# Patient Record
Sex: Female | Born: 1965 | Race: White | Hispanic: No | Marital: Married | Smoking: Never smoker
Health system: Southern US, Community
[De-identification: ages and names within clinical notes are randomized; demographics above are authoritative.]

## PROBLEM LIST (undated history)

## (undated) DIAGNOSIS — I499 Cardiac arrhythmia, unspecified: Secondary | ICD-10-CM

## (undated) DIAGNOSIS — E785 Hyperlipidemia, unspecified: Secondary | ICD-10-CM

## (undated) DIAGNOSIS — E669 Obesity, unspecified: Secondary | ICD-10-CM

## (undated) DIAGNOSIS — IMO0001 Reserved for inherently not codable concepts without codable children: Secondary | ICD-10-CM

## (undated) DIAGNOSIS — F419 Anxiety disorder, unspecified: Secondary | ICD-10-CM

## (undated) DIAGNOSIS — J45909 Unspecified asthma, uncomplicated: Secondary | ICD-10-CM

## (undated) DIAGNOSIS — K219 Gastro-esophageal reflux disease without esophagitis: Secondary | ICD-10-CM

## (undated) DIAGNOSIS — D649 Anemia, unspecified: Secondary | ICD-10-CM

## (undated) DIAGNOSIS — R002 Palpitations: Secondary | ICD-10-CM

## (undated) DIAGNOSIS — E039 Hypothyroidism, unspecified: Secondary | ICD-10-CM

## (undated) DIAGNOSIS — I10 Essential (primary) hypertension: Secondary | ICD-10-CM

## (undated) DIAGNOSIS — R0789 Other chest pain: Secondary | ICD-10-CM

## (undated) HISTORY — PX: DILATION AND CURETTAGE OF UTERUS: SHX78

## (undated) HISTORY — DX: Essential (primary) hypertension: I10

## (undated) HISTORY — DX: Hyperlipidemia, unspecified: E78.5

## (undated) HISTORY — DX: Other chest pain: R07.89

## (undated) HISTORY — DX: Palpitations: R00.2

## (undated) HISTORY — DX: Obesity, unspecified: E66.9

## (undated) HISTORY — PX: COLONOSCOPY: SHX174

## (undated) HISTORY — PX: BREAST REDUCTION SURGERY: SHX8

---

## 1996-04-07 HISTORY — PX: REDUCTION MAMMAPLASTY: SUR839

## 2005-02-14 ENCOUNTER — Ambulatory Visit: Payer: Self-pay | Admitting: Internal Medicine

## 2005-03-05 ENCOUNTER — Ambulatory Visit: Payer: Self-pay | Admitting: Internal Medicine

## 2005-04-03 ENCOUNTER — Ambulatory Visit: Payer: Self-pay | Admitting: Gastroenterology

## 2006-02-17 ENCOUNTER — Ambulatory Visit: Payer: Self-pay | Admitting: Internal Medicine

## 2007-06-24 ENCOUNTER — Ambulatory Visit: Payer: Self-pay | Admitting: Internal Medicine

## 2007-09-10 ENCOUNTER — Ambulatory Visit: Payer: Self-pay | Admitting: Surgery

## 2007-10-26 ENCOUNTER — Emergency Department: Payer: Self-pay | Admitting: Emergency Medicine

## 2007-10-26 ENCOUNTER — Other Ambulatory Visit: Payer: Self-pay

## 2008-01-19 ENCOUNTER — Ambulatory Visit: Payer: Self-pay | Admitting: Internal Medicine

## 2009-02-07 ENCOUNTER — Ambulatory Visit: Payer: Self-pay | Admitting: Internal Medicine

## 2009-11-12 ENCOUNTER — Ambulatory Visit: Payer: Self-pay | Admitting: Gastroenterology

## 2010-02-08 ENCOUNTER — Ambulatory Visit: Payer: Self-pay | Admitting: Internal Medicine

## 2010-03-26 ENCOUNTER — Emergency Department: Payer: Self-pay | Admitting: Emergency Medicine

## 2010-05-10 ENCOUNTER — Ambulatory Visit: Payer: Self-pay | Admitting: Internal Medicine

## 2010-05-14 ENCOUNTER — Ambulatory Visit: Payer: Self-pay | Admitting: Internal Medicine

## 2010-06-06 ENCOUNTER — Ambulatory Visit: Payer: Self-pay | Admitting: Internal Medicine

## 2010-07-07 ENCOUNTER — Ambulatory Visit: Payer: Self-pay | Admitting: Internal Medicine

## 2010-08-06 ENCOUNTER — Ambulatory Visit: Payer: Self-pay | Admitting: Internal Medicine

## 2010-08-12 ENCOUNTER — Ambulatory Visit: Payer: Self-pay | Admitting: Internal Medicine

## 2010-09-06 ENCOUNTER — Ambulatory Visit: Payer: Self-pay | Admitting: Internal Medicine

## 2010-09-12 ENCOUNTER — Ambulatory Visit: Payer: Self-pay | Admitting: Internal Medicine

## 2010-10-06 ENCOUNTER — Ambulatory Visit: Payer: Self-pay | Admitting: Internal Medicine

## 2010-12-23 ENCOUNTER — Ambulatory Visit: Payer: Self-pay | Admitting: Internal Medicine

## 2011-01-06 ENCOUNTER — Ambulatory Visit: Payer: Self-pay | Admitting: Internal Medicine

## 2011-01-09 ENCOUNTER — Ambulatory Visit: Payer: Self-pay | Admitting: Internal Medicine

## 2011-02-06 ENCOUNTER — Ambulatory Visit: Payer: Self-pay | Admitting: Internal Medicine

## 2011-05-02 ENCOUNTER — Ambulatory Visit: Payer: Self-pay | Admitting: Internal Medicine

## 2011-05-02 LAB — CBC CANCER CENTER
Basophil %: 0.7 %
Eosinophil %: 3.6 %
HGB: 11.8 g/dL — ABNORMAL LOW (ref 12.0–16.0)
Lymphocyte #: 1.9 x10 3/mm (ref 1.0–3.6)
Lymphocyte %: 23.5 %
MCHC: 35.1 g/dL (ref 32.0–36.0)
MCV: 94 fL (ref 80–100)
Monocyte #: 0.6 x10 3/mm (ref 0.0–0.7)
Monocyte %: 6.9 %
Neutrophil #: 5.2 x10 3/mm (ref 1.4–6.5)
Neutrophil %: 65.3 %
Platelet: 308 x10 3/mm (ref 150–440)
WBC: 8 x10 3/mm (ref 3.6–11.0)

## 2011-05-09 ENCOUNTER — Ambulatory Visit: Payer: Self-pay | Admitting: Internal Medicine

## 2011-05-22 ENCOUNTER — Ambulatory Visit: Payer: Self-pay | Admitting: Internal Medicine

## 2011-10-05 ENCOUNTER — Emergency Department: Payer: Self-pay | Admitting: *Deleted

## 2011-10-05 LAB — BASIC METABOLIC PANEL
Anion Gap: 8 (ref 7–16)
BUN: 21 mg/dL — ABNORMAL HIGH (ref 7–18)
Calcium, Total: 8.8 mg/dL (ref 8.5–10.1)
Chloride: 103 mmol/L (ref 98–107)
Co2: 29 mmol/L (ref 21–32)
Creatinine: 1.01 mg/dL (ref 0.60–1.30)
EGFR (African American): >60
EGFR (Non-African Amer.): >60
Glucose: 112 mg/dL — ABNORMAL HIGH (ref 65–99)
Osmolality: 283 (ref 275–301)
Potassium: 3.3 mmol/L — ABNORMAL LOW (ref 3.5–5.1)
Sodium: 140 mmol/L (ref 136–145)

## 2011-10-05 LAB — CBC
HCT: 39 % (ref 35.0–47.0)
MCH: 33.1 pg (ref 26.0–34.0)
MCHC: 34.7 g/dL (ref 32.0–36.0)
MCV: 95 fL (ref 80–100)
Platelet: 267 10*3/uL (ref 150–440)
RBC: 4.1 10*6/uL (ref 3.80–5.20)
RDW: 11.8 % (ref 11.5–14.5)
WBC: 8.7 10*3/uL (ref 3.6–11.0)

## 2011-10-05 LAB — TROPONIN I
Troponin-I: <0.02 ng/mL
Troponin-I: <0.02 ng/mL

## 2011-10-05 LAB — CK TOTAL AND CKMB (NOT AT ARMC)
CK, Total: 96 U/L (ref 21–215)
CK-MB: 1.1 ng/mL (ref 0.5–3.6)
CK-MB: 1 ng/mL (ref 0.5–3.6)

## 2011-12-29 ENCOUNTER — Ambulatory Visit: Payer: Self-pay | Admitting: Internal Medicine

## 2011-12-29 LAB — CBC CANCER CENTER
Basophil #: 0.1 x10 3/mm (ref 0.0–0.1)
Eosinophil %: 2 %
HGB: 13.5 g/dL (ref 12.0–16.0)
Lymphocyte #: 2.4 x10 3/mm (ref 1.0–3.6)
Lymphocyte %: 25 %
MCH: 32.3 pg (ref 26.0–34.0)
MCV: 96 fL (ref 80–100)
Monocyte #: 0.6 x10 3/mm (ref 0.2–0.9)
Monocyte %: 6.1 %
Neutrophil #: 6.2 x10 3/mm (ref 1.4–6.5)
Neutrophil %: 66.3 %
Platelet: 311 x10 3/mm (ref 150–440)
RBC: 4.17 10*6/uL (ref 3.80–5.20)
RDW: 11.6 % (ref 11.5–14.5)
WBC: 9.4 x10 3/mm (ref 3.6–11.0)

## 2011-12-29 LAB — LACTATE DEHYDROGENASE: LDH: 179 U/L (ref 81–246)

## 2012-01-06 ENCOUNTER — Ambulatory Visit: Payer: Self-pay | Admitting: Internal Medicine

## 2012-05-24 ENCOUNTER — Ambulatory Visit: Payer: Self-pay | Admitting: Internal Medicine

## 2013-04-27 ENCOUNTER — Emergency Department: Payer: Self-pay | Admitting: Emergency Medicine

## 2013-04-27 LAB — BASIC METABOLIC PANEL WITH GFR
Anion Gap: 5 — ABNORMAL LOW
BUN: 18 mg/dL
Calcium, Total: 9.2 mg/dL
Chloride: 101 mmol/L
Co2: 32 mmol/L
Creatinine: 0.88 mg/dL
EGFR (African American): >60
EGFR (Non-African Amer.): >60
Glucose: 83 mg/dL
Osmolality: 277
Potassium: 3.8 mmol/L
Sodium: 138 mmol/L

## 2013-04-27 LAB — TROPONIN I: Troponin-I: <0.02 ng/mL

## 2013-04-27 LAB — CBC
HCT: 44.1 % (ref 35.0–47.0)
HGB: 15.3 g/dL (ref 12.0–16.0)
MCH: 32.3 pg (ref 26.0–34.0)
MCHC: 34.7 g/dL (ref 32.0–36.0)
MCV: 93 fL (ref 80–100)
Platelet: 340 10*3/uL (ref 150–440)
RBC: 4.73 10*6/uL (ref 3.80–5.20)
RDW: 11.8 % (ref 11.5–14.5)
WBC: 10 10*3/uL (ref 3.6–11.0)

## 2013-07-12 ENCOUNTER — Ambulatory Visit: Payer: Self-pay | Admitting: Internal Medicine

## 2013-08-24 ENCOUNTER — Emergency Department: Payer: Self-pay | Admitting: Internal Medicine

## 2013-08-24 LAB — TROPONIN I: Troponin-I: <0.02 ng/mL

## 2013-08-24 LAB — BASIC METABOLIC PANEL
Anion Gap: 8 (ref 7–16)
BUN: 17 mg/dL (ref 7–18)
CHLORIDE: 103 mmol/L (ref 98–107)
CO2: 27 mmol/L (ref 21–32)
Calcium, Total: 9.3 mg/dL (ref 8.5–10.1)
Creatinine: 0.85 mg/dL (ref 0.60–1.30)
EGFR (Non-African Amer.): >60
Glucose: 93 mg/dL (ref 65–99)
Osmolality: 277 (ref 275–301)
Potassium: 3.6 mmol/L (ref 3.5–5.1)
Sodium: 138 mmol/L (ref 136–145)

## 2013-08-24 LAB — CBC
HCT: 43.4 % (ref 35.0–47.0)
HGB: 15.1 g/dL (ref 12.0–16.0)
MCH: 32.2 pg (ref 26.0–34.0)
MCHC: 34.8 g/dL (ref 32.0–36.0)
MCV: 93 fL (ref 80–100)
Platelet: 296 10*3/uL (ref 150–440)
RBC: 4.69 10*6/uL (ref 3.80–5.20)
RDW: 11.7 % (ref 11.5–14.5)
WBC: 9.1 10*3/uL (ref 3.6–11.0)

## 2013-08-24 LAB — D-DIMER(ARMC): D-Dimer: 435 ng/ml

## 2014-07-17 ENCOUNTER — Ambulatory Visit
Admit: 2014-07-17 | Disposition: A | Discharge status: Home / Self Care | Payer: Self-pay | Attending: Internal Medicine | Admitting: Internal Medicine

## 2014-11-11 ENCOUNTER — Emergency Department
Admission: EM | Admit: 2014-11-11 | Discharge: 2014-11-11 | Disposition: A | Discharge status: Home / Self Care | Payer: BLUE CROSS/BLUE SHIELD | Attending: Emergency Medicine | Admitting: Emergency Medicine

## 2014-11-11 ENCOUNTER — Emergency Department: Payer: BLUE CROSS/BLUE SHIELD

## 2014-11-11 ENCOUNTER — Encounter: Payer: Self-pay | Admitting: Emergency Medicine

## 2014-11-11 DIAGNOSIS — R079 Chest pain, unspecified: Secondary | ICD-10-CM

## 2014-11-11 DIAGNOSIS — R0602 Shortness of breath: Secondary | ICD-10-CM | POA: Diagnosis not present

## 2014-11-11 HISTORY — DX: Hypothyroidism, unspecified: E03.9

## 2014-11-11 LAB — BASIC METABOLIC PANEL
Anion gap: 9 (ref 5–15)
BUN: 15 mg/dL (ref 6–20)
CALCIUM: 9.3 mg/dL (ref 8.9–10.3)
CHLORIDE: 101 mmol/L (ref 101–111)
CO2: 29 mmol/L (ref 22–32)
Creatinine, Ser: 0.75 mg/dL (ref 0.44–1.00)
GFR calc Af Amer: >60 mL/min (ref >60)
Glucose, Bld: 109 mg/dL — ABNORMAL HIGH (ref 65–99)
Potassium: 3.6 mmol/L (ref 3.5–5.1)
SODIUM: 139 mmol/L (ref 135–145)

## 2014-11-11 LAB — CBC
HCT: 40.6 % (ref 35.0–47.0)
HEMOGLOBIN: 14.2 g/dL (ref 12.0–16.0)
MCH: 32.1 pg (ref 26.0–34.0)
MCHC: 35 g/dL (ref 32.0–36.0)
MCV: 91.8 fL (ref 80.0–100.0)
Platelets: 268 10*3/uL (ref 150–440)
RBC: 4.42 MIL/uL (ref 3.80–5.20)
RDW: 11.5 % (ref 11.5–14.5)
WBC: 9.5 10*3/uL (ref 3.6–11.0)

## 2014-11-11 LAB — TROPONIN I: Troponin I: <0.03 ng/mL (ref <0.031)

## 2014-11-11 MED ORDER — GI COCKTAIL ~~LOC~~
30.0000 mL | Freq: Once | ORAL | Status: AC
  Administered 2014-11-11: 30 mL via ORAL
  Filled 2014-11-11: qty 30

## 2014-11-11 MED ORDER — RANITIDINE HCL 150 MG PO TABS: 150.0000 mg | ORAL_TABLET | Freq: Every day | ORAL | Status: DC

## 2014-11-11 MED ORDER — DIAZEPAM 5 MG PO TABS: 5.0000 mg | ORAL_TABLET | Freq: Three times a day (TID) | ORAL | Status: DC | PRN

## 2014-11-11 MED ORDER — FAMOTIDINE 20 MG PO TABS
20.0000 mg | ORAL_TABLET | Freq: Once | ORAL | Status: AC
  Administered 2014-11-11: 20 mg via ORAL
  Filled 2014-11-11: qty 1

## 2014-11-11 NOTE — ED Notes (Signed)
Pt reports left sided CP starting last night, described as pressure.  Pain radiated to back.  Pt reports numbness to left arm and leg.  Pt reports SOB.  Denies nausea.  Pt reports being recently experienced sick with dizziness a few days ago and general malaise over the past week.  Pt NAD at this time.

## 2014-11-11 NOTE — ED Provider Notes (Signed)
[facility] Emergency Department Provider Note     Time seen: ----------------------------------------- 7:09 AM on 11/11/2014 -----------------------------------------    I have reviewed the triage vital signs and the nursing notes.   HISTORY  Chief Complaint Chest Pain    HPI Kimberly Rivas is a 49 y.o. female who presents ER for left-sided chest pain started last night. Describes as a burning pain, she reports numbness to the left arm and leg with some shortness of breath. She denies any nausea. Patient reports being under a lot of stress, no complaints this time. Patient states symptoms come and go.   Past Medical History  Diagnosis Date  . Hypothyroid     There are no active problems to display for this patient.   Past Surgical History  Procedure Laterality Date  . Breast reduction surgery    . Cesarean section      Allergies Review of patient's allergies indicates no known allergies.  Social History History  Substance Use Topics  . Smoking status: Never Smoker   . Smokeless tobacco: Never Used  . Alcohol Use: No    Review of Systems Constitutional: Negative for fever. Eyes: Negative for visual changes. ENT: Negative for sore throat. Cardiovascular: Positive for chest pain Respiratory: Positive for shortness of breath Gastrointestinal: Negative for abdominal pain, vomiting and diarrhea. Genitourinary: Negative for dysuria. Musculoskeletal: Negative for back pain. Skin: Negative for rash. Neurological: Negative for headaches, focal weakness or numbness.  10-point ROS otherwise negative.  ____________________________________________   PHYSICAL EXAM:  VITAL SIGNS: ED Triage Vitals  Enc Vitals Group     BP 11/11/14 0306 132/81 mmHg     Pulse Rate 11/11/14 0306 57     Resp 11/11/14 0306 20     Temp 11/11/14 0306 98.2 F (36.8 C)     Temp Source 11/11/14 0306 Oral     SpO2 11/11/14 0306 99 %     Weight 11/11/14 0306  192 lb (87.091 kg)     Height 11/11/14 0306 5\' 3"  (1.6 m)     Head Cir --      Peak Flow --      Pain Score 11/11/14 0304 4     Pain Loc --      Pain Edu? --      Excl. in [place]? --     Constitutional: Alert and oriented. Well appearing and in no distress. Eyes: Conjunctivae are normal. PERRL. Normal extraocular movements. ENT   Head: Normocephalic and atraumatic.   Nose: No congestion/rhinnorhea.   Mouth/Throat: Mucous membranes are moist.   Neck: No stridor. Cardiovascular: Normal rate, regular rhythm. Normal and symmetric distal pulses are present in all extremities. No murmurs, rubs, or gallops. Respiratory: Normal respiratory effort without tachypnea nor retractions. Breath sounds are clear and equal bilaterally. No wheezes/rales/rhonchi. Gastrointestinal: Soft and nontender. No distention. No abdominal bruits.  Musculoskeletal: Nontender with normal range of motion in all extremities. No joint effusions.  No lower extremity tenderness nor edema. Neurologic:  Normal speech and language. No gross focal neurologic deficits are appreciated. Speech is normal. No gait instability. Skin:  Skin is warm, dry and intact. No rash noted. Psychiatric: Mood and affect are normal. Speech and behavior are normal. Patient exhibits appropriate insight and judgment. ____________________________________________  EKG: Interpreted by me. Normal sinus rhythm with normal axis normal intervals, no evidence of hypertrophy or acute infarction. Rate is 63 bpm  ____________________________________________  ED COURSE:  Pertinent labs & imaging results that were available during my care  of the patient were reviewed by me and considered in my medical decision making (see chart for details).  ____________________________________________    LABS (pertinent positives/negatives)  Labs Reviewed  BASIC METABOLIC PANEL - Abnormal; Notable for the following:    Glucose, Bld 109 (*)    All other  components within normal limits  CBC  TROPONIN I    RADIOLOGY  Chest x-rays unremarkable  ____________________________________________  FINAL ASSESSMENT AND PLAN  Chest pain  Plan: Patient with labs and imaging as dictated above. Symptoms are likely either anxiety or reflux related. She is in no acute distress. She is given Zantac and a GI cocktail here. She'll be discharged with Valium and encouraged to continue taking Zantac at home. Heart score is low risk.   Earleen Newport, MD   Earleen Newport, MD 11/11/14 [phone]

## 2014-11-11 NOTE — ED Notes (Signed)
Patient reports pain to left chest/breast area.  Also reports pain to back, left arm and down left leg.  Described as a burning sensation.  Also reports feeling short of breath.

## 2014-11-11 NOTE — Discharge Instructions (Signed)

## 2015-01-25 IMAGING — CR DG CHEST 2V
1 series · 2 of 2 positions shown · non-contrast
Comparison: April 27, 2013

CLINICAL DATA: Chest pain today

EXAM:
CHEST  2 VIEW

[Series 1: pa · 0.17mm/px · 2 of 2 slices shown]
[im 1/2]
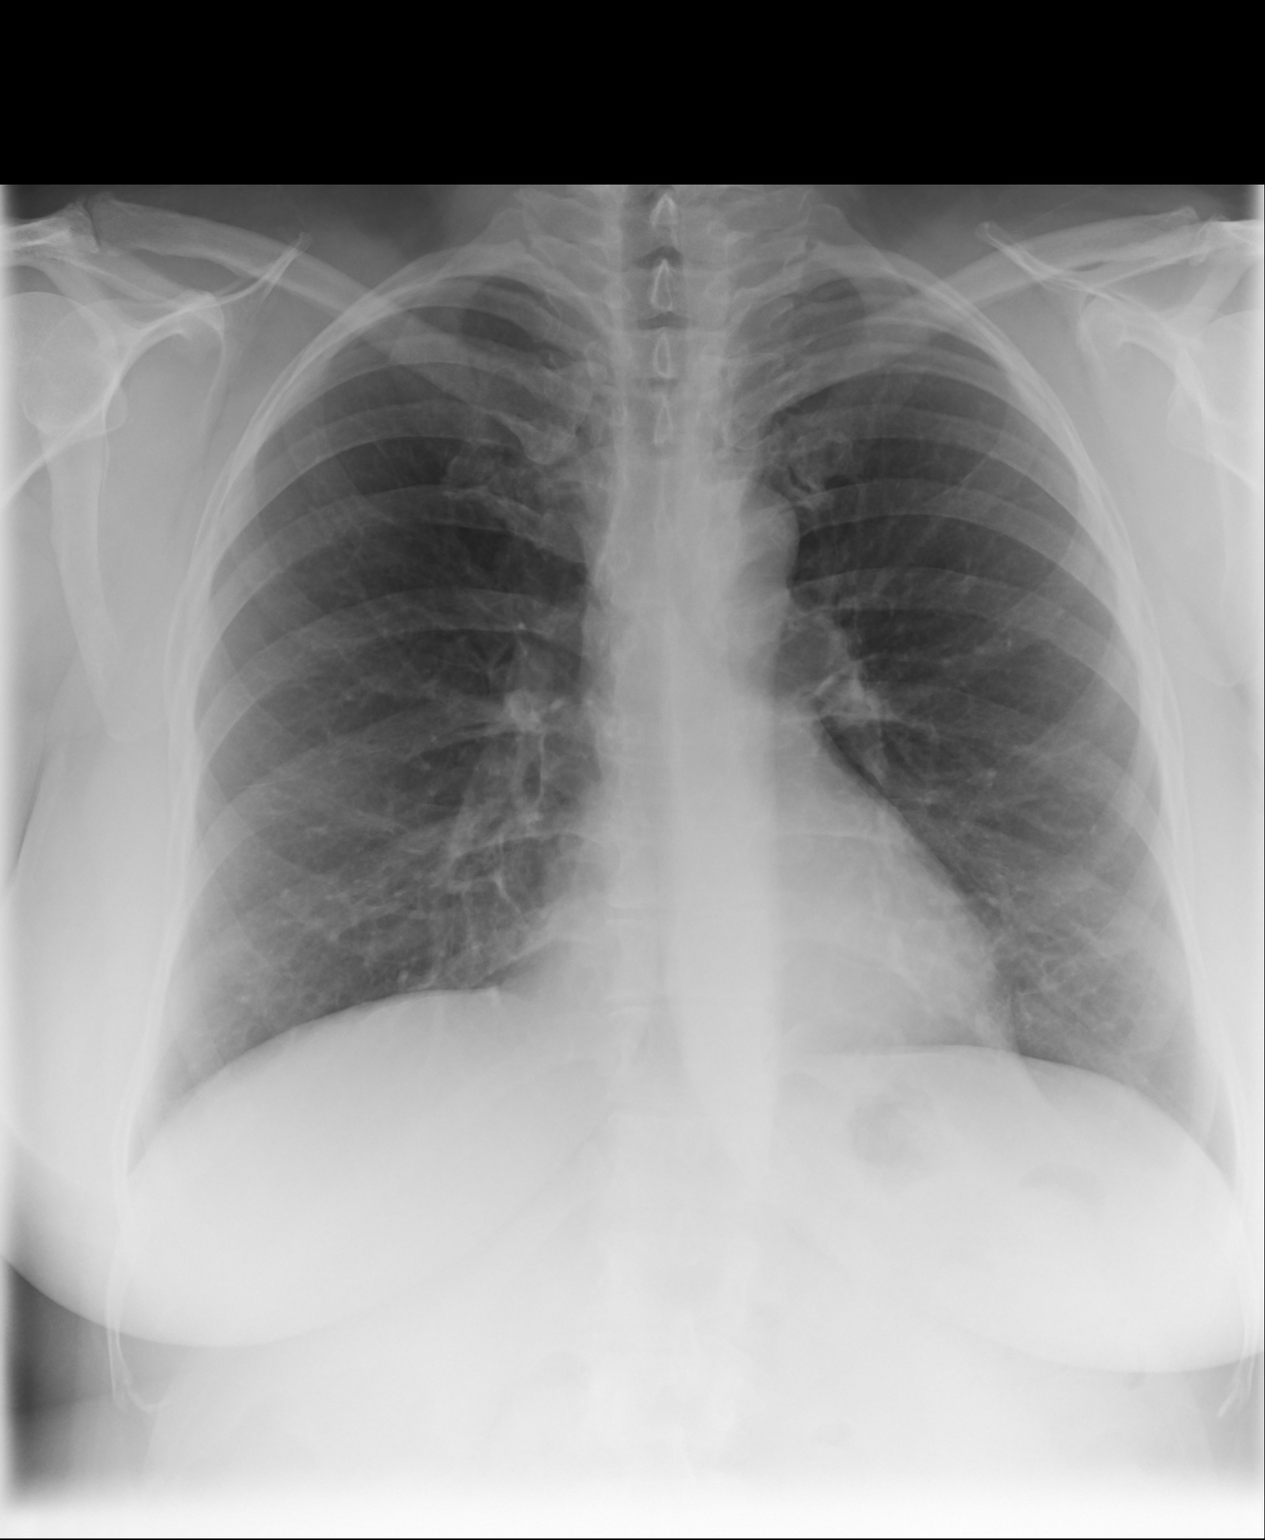
[im 2/2]
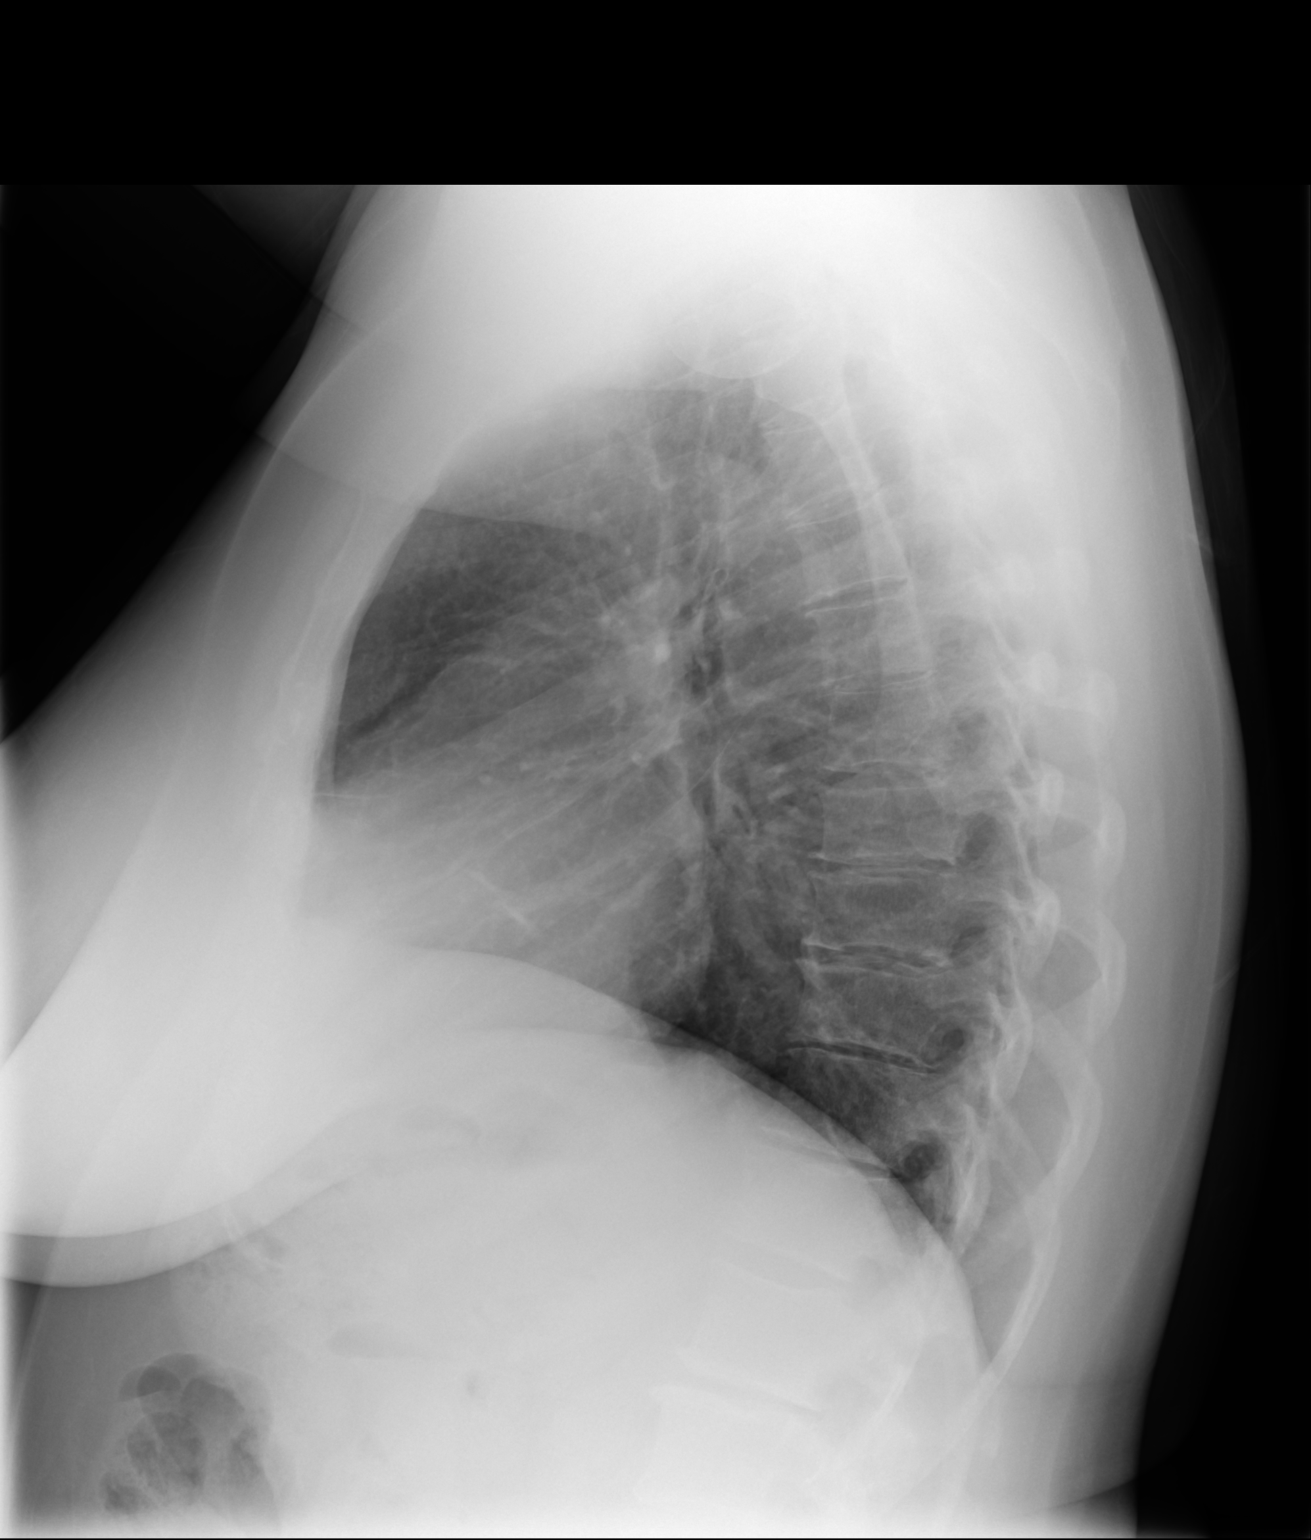

[2 of 2 positions shown; findings below may reference images not displayed]

FINDINGS: The heart size and mediastinal contours are within normal limits.
There is no focal infiltrate, pulmonary edema, or pleural effusion.
The visualized skeletal structures are stable.
IMPRESSION: No active cardiopulmonary disease.

## 2015-02-06 ENCOUNTER — Ambulatory Visit
Admission: RE | Admit: 2015-02-06 | Discharge: 2015-02-06 | Disposition: A | Discharge status: Home / Self Care | Payer: BLUE CROSS/BLUE SHIELD | Source: Ambulatory Visit | Attending: Internal Medicine | Admitting: Internal Medicine

## 2015-02-06 ENCOUNTER — Other Ambulatory Visit: Payer: Self-pay | Admitting: Internal Medicine

## 2015-02-06 DIAGNOSIS — R0789 Other chest pain: Secondary | ICD-10-CM

## 2015-02-16 ENCOUNTER — Encounter: Payer: Self-pay | Admitting: Cardiovascular Disease

## 2015-02-16 ENCOUNTER — Encounter: Payer: Self-pay | Admitting: Internal Medicine

## 2015-02-16 ENCOUNTER — Telehealth: Payer: Self-pay

## 2015-02-16 ENCOUNTER — Ambulatory Visit (INDEPENDENT_AMBULATORY_CARE_PROVIDER_SITE_OTHER): Payer: BLUE CROSS/BLUE SHIELD | Admitting: Cardiovascular Disease

## 2015-02-16 ENCOUNTER — Ambulatory Visit (INDEPENDENT_AMBULATORY_CARE_PROVIDER_SITE_OTHER): Payer: BLUE CROSS/BLUE SHIELD | Admitting: Internal Medicine

## 2015-02-16 VITALS — BP 132/84 | HR 60 | Ht 63.0 in | Wt 196.0 lb

## 2015-02-16 VITALS — BP 120/84 | HR 63 | Ht 63.0 in | Wt 196.0 lb

## 2015-02-16 DIAGNOSIS — R0789 Other chest pain: Secondary | ICD-10-CM | POA: Diagnosis not present

## 2015-02-16 DIAGNOSIS — R0602 Shortness of breath: Secondary | ICD-10-CM | POA: Diagnosis not present

## 2015-02-16 DIAGNOSIS — R400 Somnolence: Secondary | ICD-10-CM

## 2015-02-16 DIAGNOSIS — R002 Palpitations: Secondary | ICD-10-CM | POA: Diagnosis not present

## 2015-02-16 DIAGNOSIS — G4710 Hypersomnia, unspecified: Secondary | ICD-10-CM

## 2015-02-16 DIAGNOSIS — R911 Solitary pulmonary nodule: Secondary | ICD-10-CM | POA: Diagnosis not present

## 2015-02-16 MED ORDER — ALBUTEROL SULFATE HFA 108 (90 BASE) MCG/ACT IN AERS: 2.0000 | INHALATION_SPRAY | Freq: Four times a day (QID) | RESPIRATORY_TRACT | Status: DC | PRN

## 2015-02-16 NOTE — Progress Notes (Signed)
[facility] Consultation      Date: 02/16/2015,   MRN# [id number] Kimberly Rivas 11/13/1965 Code Status:  Hosp day:@LENGTHOFSTAYDAYS @ Referring MD: @ATDPROV @     PCP:      AdmissionWeight: 196 lb (88.905 kg)                 CurrentWeight: 196 lb (88.905 kg) Kimberly Rivas is a 49 y.o. old female seen in consultation for SOB, ?pulm nodule.     CHIEF COMPLAINT:   SOB   HISTORY OF PRESENT ILLNESS   49 yo white female seen today for SOB and ?lung nodule on CT chest performed 02/13/15 images reviewed 02/16/2015 Patient states that she has had intermittent SOB for last several weeks, associated with chest tightness and squeezing feeling Patient had CXR and CT chest done by PMD  Patient does NOT have any Lower ext swelling noted, patient does not have any signs of infection at this time, patient has some intermittent coughing Denies fevers, chills, no NVD.   Patient is nonsmoker but was exposed to severe second hand smoke for 17 years and had worked in [employer] for 12 years Patient states that she had pneumonia last year responded to [place]  Patient also has excessive fatigue and daytime sleepiness, patient states that she has palpitations at night time when awake     PAST MEDICAL HISTORY   Past Medical History  Diagnosis Date  . Hypothyroid   . Hypertension      SURGICAL HISTORY   Past Surgical History  Procedure Laterality Date  . Breast reduction surgery    . Cesarean section       FAMILY HISTORY   Family History  Problem Relation Age of Onset  . Acute myelogenous leukemia Mother   . COPD Mother   . Heart Problems Mother   . Hypertension Mother   . Hypothyroidism Mother   . Stroke Mother   . Stroke Father   . Epilepsy Brother   . Cancer Paternal Aunt   . Heart Problems Paternal Grandmother   . Heart Problems Paternal Grandfather      SOCIAL HISTORY   Social History  Substance Use Topics  . Smoking status: Never Smoker    . Smokeless tobacco: Never Used  . Alcohol Use: No     MEDICATIONS    Home Medication:  Current Outpatient Rx  Name  Route  Sig  Dispense  Refill  . aspirin EC 81 MG tablet   Oral   Take 81 mg by mouth daily.         Marland Kitchen levothyroxine (SYNTHROID, LEVOTHROID) 112 MCG tablet   Oral   Take 112 mcg by mouth daily before breakfast.         . metoprolol tartrate (LOPRESSOR) 25 MG tablet   Oral   Take 25 mg by mouth 2 (two) times daily.         . ranitidine (ZANTAC) 150 MG tablet   Oral   Take 1 tablet (150 mg total) by mouth at bedtime.   30 tablet   1   . albuterol (PROVENTIL HFA;VENTOLIN HFA) 108 (90 BASE) MCG/ACT inhaler   Inhalation   Inhale 2 puffs into the lungs every 6 (six) hours as needed for wheezing or shortness of breath.   1 Inhaler   2     Current Medication:  Current outpatient prescriptions:  .  aspirin EC 81 MG tablet, Take 81 mg by mouth daily., Disp: , Rfl:  .  levothyroxine (SYNTHROID, LEVOTHROID) 112 MCG tablet, Take 112 mcg by mouth daily before breakfast., Disp: , Rfl:  .  metoprolol tartrate (LOPRESSOR) 25 MG tablet, Take 25 mg by mouth 2 (two) times daily., Disp: , Rfl:  .  ranitidine (ZANTAC) 150 MG tablet, Take 1 tablet (150 mg total) by mouth at bedtime., Disp: 30 tablet, Rfl: 1 .  albuterol (PROVENTIL HFA;VENTOLIN HFA) 108 (90 BASE) MCG/ACT inhaler, Inhale 2 puffs into the lungs every 6 (six) hours as needed for wheezing or shortness of breath., Disp: 1 Inhaler, Rfl: 2    ALLERGIES   Review of patient's allergies indicates no known allergies.     REVIEW OF SYSTEMS   Review of Systems  Constitutional: Positive for malaise/fatigue. Negative for fever, chills and weight loss.  HENT: Negative for congestion, ear pain, hearing loss and tinnitus.   Eyes: Negative for blurred vision.  Respiratory: Positive for cough and shortness of breath. Negative for hemoptysis, sputum production and wheezing.   Cardiovascular: Positive for chest  pain and palpitations. Negative for leg swelling.  Gastrointestinal: Negative for heartburn, nausea, vomiting and abdominal pain.  Musculoskeletal: Negative for back pain and neck pain.  Skin: Negative for rash.  Neurological: Negative for focal weakness, weakness and headaches.  Psychiatric/Behavioral: The patient is nervous/anxious.      VS: BP 132/84 mmHg  Pulse 60  Ht 5\' 3"  (1.6 m)  Wt 196 lb (88.905 kg)  BMI 34.73 kg/m2  SpO2 94%     PHYSICAL EXAM  Physical Exam  Constitutional: She is oriented to person, place, and time. She appears well-developed and well-nourished. No distress.  HENT:  Head: Normocephalic and atraumatic.  Mouth/Throat: No oropharyngeal exudate.  Eyes: EOM are normal. Pupils are equal, round, and reactive to light. No scleral icterus.  Neck: Normal range of motion. Neck supple.  Cardiovascular: Normal rate, regular rhythm and normal heart sounds.   No murmur heard. Pulmonary/Chest: No stridor. No respiratory distress. She has no wheezes.  Abdominal: Soft. Bowel sounds are normal.  Musculoskeletal: Normal range of motion. She exhibits no edema.  Neurological: She is alert and oriented to person, place, and time. No cranial nerve deficit.  Skin: Skin is warm and dry. She is not diaphoretic.  Psychiatric: She has a normal mood and affect.       IMAGING    Dg Chest 2 View  02/07/2015  CLINICAL DATA:  Chest discomfort for 3 weeks with slight shortness of breath, personal history hypertension, thyroid disease EXAM: CHEST  2 VIEW COMPARISON:  11/11/2014 FINDINGS: Normal heart size, mediastinal contours, and pulmonary vascularity. RIGHT middle lobe scarring. Minimal atelectasis versus scarring in lingula as well though a new nodular density is identified at the lateral LEFT base 7 mm diameter. Remaining lungs clear. No pleural effusion or pneumothorax. Degenerative changes BILATERAL AC joints. IMPRESSION: RIGHT middle lobe scarring. Minimal atelectasis or  scarring in lingula with new 7 mm nodular density; CT chest recommended to exclude pulmonary nodule. These results will be called to the ordering clinician or representative by the Radiologist Assistant, and communication documented in the PACS or zVision Dashboard. Electronically Signed   By: Lavonia Dana M.D.   On: 02/07/2015 08:05       ASSESSMENT/PLAN   49 yo white female seen today for SOB with fatigue and palpitations and excessive daytime sleepiness along with h/o pulm nodule.  Patient will need further diagnostic testing at this time, it is possible that patient may have underlying obstructive airways disease and sleep apnea.  Will also need to assess for hypoxia  1.obtain PFT's and 6MWT 2.check overnight pulse oximetry 3.albuterol as needed 4.f/u Ct chest in 4 months at next visit to assess interval changes 5.obtain Sleep study  Follow up after all tests are completed    I have personally obtained a history, examined the patient, evaluated laboratory and independently reviewed imaging results, formulated the assessment and plan and placed orders. The Patient requires high complexity decision making for assessment and support, frequent evaluation and titration of therapies, application of advanced monitoring technologies and extensive interpretation of multiple databases.  Patient satisfied with Plan of action and management. All questions answered  Corrin Parker, M.D.  [facility]  Medical Director [place] Director [facility]

## 2015-02-16 NOTE — Assessment & Plan Note (Signed)
She describes almost daily palpitations especially at night. Thus, I requested a 48-hour Holter monitor to see if there is any evidence of arrhythmia.

## 2015-02-16 NOTE — Patient Instructions (Signed)

## 2015-02-16 NOTE — Patient Instructions (Addendum)
Medication Instructions:  Your physician recommends that you continue on your current medications as directed. Please refer to the Current Medication list given to you today.   Labwork: None  Testing/Procedures: Your physician has recommended that you wear a 48 holter monitor. Holter monitors are medical devices that record the heart's electrical activity. Doctors most often use these monitors to diagnose arrhythmias. Arrhythmias are problems with the speed or rhythm of the heartbeat. The monitor is a small, portable device. You can wear one while you do your normal daily activities. This is usually used to diagnose what is causing palpitations/syncope (passing out).    Follow-Up: Your physician recommends that you schedule a follow-up appointment in: one month with Dr. Fletcher Anon.    Any Other Special Instructions Will Be Listed Below (If Applicable).     If you need a refill on your cardiac medications before your next appointment, please call your pharmacy.  Cardiac Event Monitoring A cardiac event monitor is a small recording device used to help detect abnormal heart rhythms (arrhythmias). The monitor is used to record heart rhythm when noticeable symptoms such as the following occur:  Fast heartbeats (palpitations), such as heart racing or fluttering.  Dizziness.  Fainting or light-headedness.  Unexplained weakness. The monitor is wired to two electrodes placed on your chest. Electrodes are flat, sticky disks that attach to your skin. The monitor can be worn for up to 30 days. You will wear the monitor at all times, except when bathing.  HOW TO USE YOUR CARDIAC EVENT MONITOR A technician will prepare your chest for the electrode placement. The technician will show you how to place the electrodes, how to work the monitor, and how to replace the batteries. Take time to practice using the monitor before you leave the office. Make sure you understand how to send the information from  the monitor to your health care provider. This requires a telephone with a landline, not a cell phone. You need to:  Wear your monitor at all times, except when you are in water:  Do not get the monitor wet.  Take the monitor off when bathing. Do not swim or use a hot tub with it on.  Keep your skin clean. Do not put body lotion or moisturizer on your chest.  Change the electrodes daily or any time they stop sticking to your skin. You might need to use tape to keep them on.  It is possible that your skin under the electrodes could become irritated. To keep this from happening, try to put the electrodes in slightly different places on your chest. However, they must remain in the area under your left breast and in the upper right section of your chest.  Make sure the monitor is safely clipped to your clothing or in a location close to your body that your health care provider recommends.  Press the button to record when you feel symptoms of heart trouble, such as dizziness, weakness, light-headedness, palpitations, thumping, shortness of breath, unexplained weakness, or a fluttering or racing heart. The monitor is always on and records what happened slightly before you pressed the button, so do not worry about being too late to get good information.  Keep a diary of your activities, such as walking, doing chores, and taking medicine. It is especially important to note what you were doing when you pushed the button to record your symptoms. This will help your health care provider determine what might be contributing to your symptoms. The information stored  in your monitor will be reviewed by your health care provider alongside your diary entries.  Send the recorded information as recommended by your health care provider. It is important to understand that it will take some time for your health care provider to process the results.  Change the batteries as recommended by your health care  provider. SEEK IMMEDIATE MEDICAL CARE IF:   You have chest pain.  You have extreme difficulty breathing or shortness of breath.  You develop a very fast heartbeat that persists.  You develop dizziness that does not go away.  You faint or constantly feel you are about to faint.   This information is not intended to replace advice given to you by your health care provider. Make sure you discuss any questions you have with your health care provider.   Document Released: 01/01/2008 Document Revised: 04/14/2014 Document Reviewed: 09/20/2012 Elsevier Interactive Patient Education [employer].

## 2015-02-16 NOTE — Assessment & Plan Note (Signed)
The chest pain is overall atypical and nonexertional. I suspect that this is likely due to stress and anxiety. Cardiac exam is unremarkable. Baseline ECG is normal and recent nuclear stress test was also unremarkable. I reassured the patient. I will plan on seeing the patient in one month. If there is no improvement, further testing with CTA of the coronary arteries can be considered.

## 2015-02-16 NOTE — Telephone Encounter (Signed)
Pt needs a note for work today. Please call.

## 2015-02-16 NOTE — Telephone Encounter (Signed)
S/w pt of need for work note. Note printed and at front desk for pt p/u. Pt verbalized understanding.

## 2015-02-16 NOTE — Progress Notes (Signed)
Primary care physician:Dr. Rosario Jacks  HPI  This is a 49 year old female who was referred for evaluation of chest pain. She reports prolonged history of exertional palpitations and has been on metoprolol for few years. She has no history of hypertension, diabetes or tobacco use. She does have hyperlipidemia. There is no family history of premature coronary artery disease. Over the last 3-4 weeks, she has experienced continuous feeling of substernal chest tightness radiating to her throat which does not worsen with physical activities. She underwent a treadmill nuclear stress test and was able to exercise for almost 9 minutes with no chest pain, EKG changes or perfusion defects. CT scan of the chest showed pulmonary nodule and she was seen by [facility] for this with plans for repeat CT scan in the future. The patient does appear to be anxious and she has been stressed out lately. She complains of daily palpitations at night that interferes with her ability to sleep.  No Known Allergies   Current Outpatient Prescriptions on File Prior to Visit  Medication Sig Dispense Refill  . aspirin EC 81 MG tablet Take 81 mg by mouth daily.    Marland Kitchen levothyroxine (SYNTHROID, LEVOTHROID) 112 MCG tablet Take 112 mcg by mouth daily before breakfast.    . metoprolol tartrate (LOPRESSOR) 25 MG tablet Take 25 mg by mouth 2 (two) times daily.    . ranitidine (ZANTAC) 150 MG tablet Take 1 tablet (150 mg total) by mouth at bedtime. 30 tablet 1   No current facility-administered medications on file prior to visit.     Past Medical History  Diagnosis Date  . Hypothyroid   . Hypertension      Past Surgical History  Procedure Laterality Date  . Breast reduction surgery    . Cesarean section       Family History  Problem Relation Age of Onset  . Acute myelogenous leukemia Mother   . COPD Mother   . Heart Problems Mother   . Hypertension Mother   . Hypothyroidism Mother   . Stroke Mother   . Stroke Father   .  Epilepsy Brother   . Cancer Paternal Aunt   . Heart Problems Paternal Grandmother   . Heart Problems Paternal Grandfather      Social History   Social History  . Marital Status: Married    Spouse Name: N/A  . Number of Children: N/A  . Years of Education: N/A   Occupational History  . Not on file.   Social History Main Topics  . Smoking status: Never Smoker   . Smokeless tobacco: Never Used  . Alcohol Use: No  . Drug Use: No  . Sexual Activity: Not on file   Other Topics Concern  . Not on file   Social History Narrative     ROS A 10 point review of system was performed. It is negative other than that mentioned in the history of present illness.   PHYSICAL EXAM   BP 120/84 mmHg  Pulse 63  Ht 5\' 3"  (1.6 m)  Wt 196 lb (88.905 kg)  BMI 34.73 kg/m2 Constitutional: She is oriented to person, place, and time. She appears well-developed and well-nourished. No distress.  HENT: No nasal discharge.  Head: Normocephalic and atraumatic.  Eyes: Pupils are equal and round. No discharge.  Neck: Normal range of motion. Neck supple. No JVD present. No thyromegaly present.  Cardiovascular: Normal rate, regular rhythm, normal heart sounds. Exam reveals no gallop and no friction rub. No murmur heard.  Pulmonary/Chest:  Effort normal and breath sounds normal. No stridor. No respiratory distress. She has no wheezes. She has no rales. She exhibits no tenderness.  Abdominal: Soft. Bowel sounds are normal. She exhibits no distension. There is no tenderness. There is no rebound and no guarding.  Musculoskeletal: Normal range of motion. She exhibits no edema and no tenderness.  Neurological: She is alert and oriented to person, place, and time. Coordination normal.  Skin: Skin is warm and dry. No rash noted. She is not diaphoretic. No erythema. No pallor.  Psychiatric: She has a normal mood and affect. Her behavior is normal. Judgment and thought content normal.     EKG:  NSR   ASSESSMENT AND PLAN

## 2015-02-20 ENCOUNTER — Telehealth: Payer: Self-pay | Admitting: Internal Medicine

## 2015-02-20 NOTE — Telephone Encounter (Signed)
L/M for Angie to call back.

## 2015-02-20 NOTE — Telephone Encounter (Signed)
Office called back since they referred the patient we sent the records

## 2015-02-21 ENCOUNTER — Telehealth: Payer: Self-pay | Admitting: Internal Medicine

## 2015-02-21 ENCOUNTER — Encounter: Payer: Self-pay | Admitting: *Deleted

## 2015-02-21 ENCOUNTER — Ambulatory Visit (INDEPENDENT_AMBULATORY_CARE_PROVIDER_SITE_OTHER): Payer: BLUE CROSS/BLUE SHIELD

## 2015-02-21 DIAGNOSIS — R002 Palpitations: Secondary | ICD-10-CM | POA: Diagnosis not present

## 2015-02-21 NOTE — Telephone Encounter (Signed)
Patient wants work note for November appt. Please call when ready

## 2015-02-21 NOTE — Telephone Encounter (Signed)
Note given to pt

## 2015-02-27 ENCOUNTER — Encounter: Payer: Self-pay | Admitting: Internal Medicine

## 2015-03-20 ENCOUNTER — Other Ambulatory Visit: Payer: Self-pay | Admitting: Internal Medicine

## 2015-03-20 DIAGNOSIS — Z1231 Encounter for screening mammogram for malignant neoplasm of breast: Secondary | ICD-10-CM

## 2015-03-21 ENCOUNTER — Ambulatory Visit: Payer: BLUE CROSS/BLUE SHIELD | Admitting: Nurse Practitioner

## 2015-04-11 ENCOUNTER — Telehealth: Payer: Self-pay | Admitting: *Deleted

## 2015-04-11 NOTE — Telephone Encounter (Signed)
Pt is calling asking if nurse can call her back  She is wanting to make sure all her results were okay and also wanted to ask if she really needs to keep the appointment set for Friday the 6th Please advise.

## 2015-04-11 NOTE — Telephone Encounter (Signed)
S/w pt who is aware of holter monitor results. States she is unsure if she needs f/u appt which is scheduled Jan 6 w/Ryan Dunn. Advised pt that at 11/11 OV w/Dr. Fletcher Anon, he recommended one month f/u. She is unsure if she wants to "spend the money and not even see Dr. Fletcher Anon" however, she does state she will be here as scheduled. Pt had no further questions.

## 2015-04-12 ENCOUNTER — Encounter: Payer: Self-pay | Admitting: Physician Assistant

## 2015-04-13 ENCOUNTER — Encounter: Payer: Self-pay | Admitting: Physician Assistant

## 2015-04-13 ENCOUNTER — Ambulatory Visit (INDEPENDENT_AMBULATORY_CARE_PROVIDER_SITE_OTHER): Payer: BLUE CROSS/BLUE SHIELD | Admitting: Physician Assistant

## 2015-04-13 VITALS — BP 130/82 | HR 82 | Ht 63.0 in | Wt 198.0 lb

## 2015-04-13 DIAGNOSIS — E669 Obesity, unspecified: Secondary | ICD-10-CM | POA: Insufficient documentation

## 2015-04-13 DIAGNOSIS — R0789 Other chest pain: Secondary | ICD-10-CM | POA: Insufficient documentation

## 2015-04-13 DIAGNOSIS — I10 Essential (primary) hypertension: Secondary | ICD-10-CM

## 2015-04-13 DIAGNOSIS — R002 Palpitations: Secondary | ICD-10-CM

## 2015-04-13 DIAGNOSIS — E039 Hypothyroidism, unspecified: Secondary | ICD-10-CM | POA: Insufficient documentation

## 2015-04-13 DIAGNOSIS — R0602 Shortness of breath: Secondary | ICD-10-CM

## 2015-04-13 MED ORDER — HYDROCHLOROTHIAZIDE 12.5 MG PO CAPS: 12.5000 mg | ORAL_CAPSULE | Freq: Every day | ORAL | Status: DC

## 2015-04-13 NOTE — Patient Instructions (Addendum)
Medication Instructions:  Please start HCTZ 12.5 mg once daily  Labwork: BMET, CBC, TSH  Testing/Procedures: Your physician has requested that you have an echocardiogram. Echocardiography is a painless test that uses sound waves to create images of your heart. It provides your doctor with information about the size and shape of your heart and how well your heart's chambers and valves are working. This procedure takes approximately one hour. There are no restrictions for this procedure.  Date & time:  ___________________________________________  Follow-Up: 3 months  If you need a refill on your cardiac medications before your next appointment, please call your pharmacy.  Echocardiogram An echocardiogram, or echocardiography, uses sound waves (ultrasound) to produce an image of your heart. The echocardiogram is simple, painless, obtained within a short period of time, and offers valuable information to your health care provider. The images from an echocardiogram can provide information such as:  Evidence of coronary artery disease (CAD).  Heart size.  Heart muscle function.  Heart valve function.  Aneurysm detection.  Evidence of a past heart attack.  Fluid buildup around the heart.  Heart muscle thickening.  Assess heart valve function. LET [facility] CARE PROVIDER KNOW ABOUT:  Any allergies you have.  All medicines you are taking, including vitamins, herbs, eye drops, creams, and over-the-counter medicines.  Previous problems you or members of your family have had with the use of anesthetics.  Any blood disorders you have.  Previous surgeries you have had.  Medical conditions you have.  Possibility of pregnancy, if this applies. BEFORE THE PROCEDURE  No special preparation is needed. Eat and drink normally.  PROCEDURE   In order to produce an image of your heart, gel will be applied to your chest and a wand-like tool (transducer) will be moved over your chest.  The gel will help transmit the sound waves from the transducer. The sound waves will harmlessly bounce off your heart to allow the heart images to be captured in real-time motion. These images will then be recorded.  You may need an IV to receive a medicine that improves the quality of the pictures. AFTER THE PROCEDURE You may return to your normal schedule including diet, activities, and medicines, unless your health care provider tells you otherwise.   This information is not intended to replace advice given to you by your health care provider. Make sure you discuss any questions you have with your health care provider.   Document Released: 03/21/2000 Document Revised: 04/14/2014 Document Reviewed: 11/29/2012 Elsevier Interactive Patient Education [employer].

## 2015-04-13 NOTE — Progress Notes (Signed)
Cardiology Office Note:  Date of Encounter: 04/13/2015  ID: Kimberly Rivas, DOB December 08, 1965, MRN [id number]  PCP:  Casilda Carls, MD Primary Cardiologist:  Dr. Fletcher Anon, MD  Chief Complaint  Patient presents with  . other    1 month follow up as well as discuss 48 hour holter monitor. Pt. c/o palpitations still. Meds reviewed by the patient verbally.     HPI:  50 year old female with history of atypical, nonexertional chest pain, palpitations, especially at night, hypothyroidism, HTN, and HLD. She was referred to Dr. Fletcher Anon, MD on 02/16/2015 for evaluation of her chest pain and reported a prolonged history of exertional palpitations and had been on metoprolol for approximately 5 years. She was also previously on HCTZ for HTN and "swelling," however the combination of HCTZ and metoprolol 37.5 mg bid led to soft BP and bradycardia and eventual discontinuation of the HCTZ and tapering of her metoprolol back down to 25 mg bid.  Beginning in October 2016, she had experienced continuous feeling of substernal chest tightness radiating to her throat which did not worsen with physical activities. She underwent a treadmill stress test, through [facility], and was able to exercise for almost 9 minutes with no chest pain, EKG changes or perfusion defects. CT scan of the chest showed pulmonary nodule and she was seen by [facility] for this with plans for repeat CT scan in the future. Reportedly, the patient did appear to be anxious and she had been stressed out lately. She complained of daily palpitations at night that interfered with her ability to sleep. She has no known history of hypertension, diabetes or tobacco use. There is no family history of premature coronary artery disease. She wore a 48 hour Holter monitor that showed normal sinus rhythm, rare PACs and PVCs. Overall, no significant arrhythmia.  She continues to note palpitations, more so at night when trying to sleep, but also when at work.  Her palpitations during the day last "a second" before self resolving. There is associated mild SOB, otherwise no associated chest pain, nausea, vomiting, diaphoresis, presyncope, or syncope. She has started taking Zantac which has helped with her reflux. She has been under a lot of stress over the past couple of months with the holidays. This is starting to decrease. She eats a poor diet, applies extra salt to her food, and does not have a regular exercise routine. Her PCP last checked her TSH in November 2016 with the patient reporting this being normal. She has been on levothyroxine 112 mcg for the past 12 months approximately. Prior to that she reports her thyroid function was difficult to control. She has not filled the albuterol inhaler that was prescribed by pulmonary.     Past Medical History  Diagnosis Date  . Hypothyroid     a. dx'd at age 77  . Hypertension   . HLD (hyperlipidemia)   . Palpitations     a. 48 hr Holter 02/2015: NSR, rare PACs and PVCs. Otherwise, no significant arrhythmia   . Atypical chest pain   . Obese   :  Past Surgical History  Procedure Laterality Date  . Breast reduction surgery    . Cesarean section    :  Social History:  The patient  reports that she has never smoked. She has never used smokeless tobacco. She reports that she does not drink alcohol or use illicit drugs.   Family History  Problem Relation Age of Onset  . Acute myelogenous leukemia Mother   .  COPD Mother   . Heart Problems Mother   . Hypertension Mother   . Hypothyroidism Mother   . Stroke Mother   . Stroke Father   . Epilepsy Brother   . Cancer Paternal Aunt   . Heart Problems Paternal Grandmother   . Heart Problems Paternal Grandfather      Allergies:  No Known Allergies   Home Medications:  Current Outpatient Prescriptions  Medication Sig Dispense Refill  . aspirin EC 81 MG tablet Take 81 mg by mouth daily.    Marland Kitchen levothyroxine (SYNTHROID, LEVOTHROID) 112 MCG tablet  Take 112 mcg by mouth daily before breakfast.    . metoprolol tartrate (LOPRESSOR) 25 MG tablet Take 25 mg by mouth 2 (two) times daily.    . ranitidine (ZANTAC) 150 MG tablet Take 1 tablet (150 mg total) by mouth at bedtime. 30 tablet 1  . hydrochlorothiazide (MICROZIDE) 12.5 MG capsule Take 1 capsule (12.5 mg total) by mouth daily. 90 capsule 3   No current facility-administered medications for this visit.     Review of Systems:  Review of Systems  Constitutional: Positive for malaise/fatigue. Negative for fever, chills, weight loss and diaphoresis.  HENT: Negative for congestion.   Eyes: Negative for discharge and redness.  Respiratory: Positive for shortness of breath. Negative for cough, hemoptysis, sputum production and wheezing.   Cardiovascular: Positive for palpitations. Negative for chest pain, orthopnea, claudication, leg swelling and PND.  Gastrointestinal: Positive for heartburn. Negative for nausea, vomiting and abdominal pain.  Musculoskeletal: Negative for falls.  Skin: Negative for rash.  Neurological: Negative for dizziness, tremors, sensory change, speech change, focal weakness, loss of consciousness, weakness and headaches.  Endo/Heme/Allergies: Does not bruise/bleed easily.  Psychiatric/Behavioral: Negative for hallucinations, memory loss and substance abuse. The patient is nervous/anxious. The patient does not have insomnia.   All other systems reviewed and are negative.    Physical Exam:  Blood pressure 130/82, pulse 82, height 5\' 3"  (1.6 m), weight 198 lb (89.812 kg). BMI: Body mass index is 35.08 kg/(m^2). General: Pleasant, NAD. Psych: Normal affect. Responds to questions with normal affect.  Neuro: Alert and oriented X 3. Moves all extremities spontaneously. HEENT: Normocephalic, atraumatic. EOM intact. Sclera anicteric.  Neck: Trachea midline. Supple without bruits or JVD. Lungs:  Respirations regular and unlabored. CTA bilaterally without wheezing,  crackles, or rhonchi.  Heart: RRR, normal s3, s4. No murmurs, rubs, or gallops.  Abdomen: Obese, soft, non-tender, non-distended, BS + x 4.  Extremities: No clubbing, cyanosis or edema. DP/PT/Radials 2+ and equal bilaterally.   Accessory Clinical Findings:  EKG: NSR, 82 bpm, no significant st/t changes   Recent Labs: 11/11/2014: BUN 15; Creatinine, Ser 0.75; Hemoglobin 14.2; Platelets 268; Potassium 3.6; Sodium 139  No results found for requested labs within last 365 days.  CrCl cannot be calculated (Patient has no serum creatinine result on file.).  Weights: Wt Readings from Last 3 Encounters:  04/13/15 198 lb (89.812 kg)  02/16/15 196 lb (88.905 kg)  02/16/15 196 lb (88.905 kg)    Other studies Reviewed: Additional studies/ records that were reviewed today include: prior cardiology notes and studies.  Assessment & Plan:  1. Palpitations:  -Symptoms persist as above -Prior bmet and cbc unrevealing in August 2016 -48 hour Holter 02/2015 without significant arrhythmia  -Check TSH, bmet, and cbc -Continue Lopressor 25 mg bid. As below, patient previously did not tolerate titration of metoprolol to 37.5 mg bid secondary to bradycardia -Check echo to evaluate for structural heart abnormalities given her  reported history of swelling and prior need for diuretic and SOB  2. Atypical chest pain:  -Resolved -Likely 2/2 stress/anxiety/reflux as her symptoms improved with Zantac -Stress test fall 2016 normal through [facility]. Attempt to obtain records  -If her symptoms return can pursue CTA of the coronary arteries   3. HTN: -BP typically runs in the mid to upper [id number] systolic -She previously did not tolerate titration of metoprolol from 25 mg bid to 37.5 mg bid secondary to bradycardia -Add HCTZ 12.5 mg daily -Continue Lopressor 25 mg bid -Decrease salt consumption, discussed with patient that sea salt was still salt  4. Hypothyroidism:  -Check TSH as above  -Per  PCP   5. Obesity/possible sleep apnea: -Weight loss advised  -Sleep study scheduled with pulmonology   6. Pulmonary nodule: -Has follow up with pulmonology     Dispo: -Follow up 3 months  Current medicines are reviewed at length with the patient today.  The patient did not have any concerns regarding medicines.   Christell Faith, PA-C [place] [address] [place], [place] [address] [phone] [place] 04/13/2015, 3:20 PM

## 2015-04-14 LAB — BASIC METABOLIC PANEL
BUN/Creatinine Ratio: 23 (ref 9–23)
BUN: 21 mg/dL (ref 6–24)
CHLORIDE: 100 mmol/L (ref 96–106)
CO2: 21 mmol/L (ref 18–29)
Calcium: 9.8 mg/dL (ref 8.7–10.2)
Creatinine, Ser: 0.91 mg/dL (ref 0.57–1.00)
GFR calc Af Amer: 84 mL/min/{1.73_m2} (ref >59)
GFR calc non Af Amer: 73 mL/min/{1.73_m2} (ref >59)
Glucose: 120 mg/dL — ABNORMAL HIGH (ref 65–99)
POTASSIUM: 4.7 mmol/L (ref 3.5–5.2)
Sodium: 143 mmol/L (ref 134–144)

## 2015-04-14 LAB — CBC WITH DIFFERENTIAL/PLATELET
BASOS ABS: 0.1 10*3/uL (ref 0.0–0.2)
Basos: 1 %
EOS (ABSOLUTE): 0.4 10*3/uL (ref 0.0–0.4)
Eos: 4 %
HEMOGLOBIN: 13.9 g/dL (ref 11.1–15.9)
Hematocrit: 40.7 % (ref 34.0–46.6)
Immature Grans (Abs): 0 10*3/uL (ref 0.0–0.1)
Immature Granulocytes: 0 %
LYMPHS ABS: 2.2 10*3/uL (ref 0.7–3.1)
Lymphs: 23 %
MCH: 32 pg (ref 26.6–33.0)
MCHC: 34.2 g/dL (ref 31.5–35.7)
MCV: 94 fL (ref 79–97)
Monocytes Absolute: 0.7 10*3/uL (ref 0.1–0.9)
Monocytes: 7 %
Neutrophils Absolute: 6.5 10*3/uL (ref 1.4–7.0)
Neutrophils: 65 %
Platelets: 321 10*3/uL (ref 150–379)
RBC: 4.35 x10E6/uL (ref 3.77–5.28)
RDW: 11.5 % — ABNORMAL LOW (ref 12.3–15.4)
WBC: 9.8 10*3/uL (ref 3.4–10.8)

## 2015-04-14 LAB — TSH: TSH: 2.34 u[IU]/mL (ref 0.450–4.500)

## 2015-04-20 ENCOUNTER — Other Ambulatory Visit: Payer: Self-pay

## 2015-04-20 ENCOUNTER — Ambulatory Visit (INDEPENDENT_AMBULATORY_CARE_PROVIDER_SITE_OTHER): Payer: BLUE CROSS/BLUE SHIELD

## 2015-04-20 DIAGNOSIS — R0789 Other chest pain: Secondary | ICD-10-CM | POA: Diagnosis not present

## 2015-04-20 DIAGNOSIS — E669 Obesity, unspecified: Secondary | ICD-10-CM

## 2015-04-20 DIAGNOSIS — R002 Palpitations: Secondary | ICD-10-CM

## 2015-04-20 DIAGNOSIS — E039 Hypothyroidism, unspecified: Secondary | ICD-10-CM

## 2015-04-20 DIAGNOSIS — R0602 Shortness of breath: Secondary | ICD-10-CM

## 2015-04-23 ENCOUNTER — Emergency Department: Payer: BLUE CROSS/BLUE SHIELD

## 2015-04-23 ENCOUNTER — Emergency Department
Admission: EM | Admit: 2015-04-23 | Discharge: 2015-04-23 | Disposition: A | Discharge status: Home / Self Care | Payer: BLUE CROSS/BLUE SHIELD | Attending: Emergency Medicine | Admitting: Emergency Medicine

## 2015-04-23 DIAGNOSIS — X501XXA Overexertion from prolonged static or awkward postures, initial encounter: Secondary | ICD-10-CM | POA: Diagnosis not present

## 2015-04-23 DIAGNOSIS — Y9339 Activity, other involving climbing, rappelling and jumping off: Secondary | ICD-10-CM | POA: Diagnosis not present

## 2015-04-23 DIAGNOSIS — S86812A Strain of other muscle(s) and tendon(s) at lower leg level, left leg, initial encounter: Secondary | ICD-10-CM | POA: Diagnosis not present

## 2015-04-23 DIAGNOSIS — Y998 Other external cause status: Secondary | ICD-10-CM | POA: Insufficient documentation

## 2015-04-23 DIAGNOSIS — Y9289 Other specified places as the place of occurrence of the external cause: Secondary | ICD-10-CM | POA: Insufficient documentation

## 2015-04-23 DIAGNOSIS — Z79899 Other long term (current) drug therapy: Secondary | ICD-10-CM | POA: Insufficient documentation

## 2015-04-23 DIAGNOSIS — S8992XA Unspecified injury of left lower leg, initial encounter: Secondary | ICD-10-CM | POA: Diagnosis present

## 2015-04-23 DIAGNOSIS — Z7982 Long term (current) use of aspirin: Secondary | ICD-10-CM | POA: Insufficient documentation

## 2015-04-23 DIAGNOSIS — S86912A Strain of unspecified muscle(s) and tendon(s) at lower leg level, left leg, initial encounter: Secondary | ICD-10-CM

## 2015-04-23 MED ORDER — TRAMADOL HCL 50 MG PO TABS: 50.0000 mg | ORAL_TABLET | Freq: Two times a day (BID) | ORAL | Status: DC

## 2015-04-23 MED ORDER — TRAMADOL HCL 50 MG PO TABS
100.0000 mg | ORAL_TABLET | Freq: Once | ORAL | Status: AC
  Administered 2015-04-23: 100 mg via ORAL
  Filled 2015-04-23: qty 2

## 2015-04-23 NOTE — Discharge Instructions (Signed)
Your exam and x-ray are consistent with a knee strain. Wear the knee brace as needed for support. Follow-up with Dr. Marry Guan for ongoing symptoms.

## 2015-04-23 NOTE — ED Notes (Signed)
Pt twisted left leg Saturday co persistent left knee pain.

## 2015-04-23 NOTE — ED Provider Notes (Signed)
[facility] Emergency Department Provider Note ____________________________________________  Time seen: 2215  I have reviewed the triage vital signs and the nursing notes.  HISTORY  Chief Complaint  Knee Pain  HPI Kimberly Rivas is a 50 y.o. female presents to the ED for evaluation of pain to the left knee as well as discomfort when attempting to bear weight and walk.She describes injury apparently happened on Saturday she was attempting to climb into the bed while carrying her toddler grandchild. She describes immediate pain to the left knee and she assumed she inadvertently twisted it. Since that time she's had persistent medial knee pain and increased discomfort with attempts to bear weight. She denies a history of prior knee injury, surgery, or dislocation. She is dosed ibuprofen intermittently for symptom relief. She rates her discomfort in her knee at a 3/10 in triage.  Past Medical History  Diagnosis Date  . Hypothyroid     a. dx'd at age 15  . Hypertension   . HLD (hyperlipidemia)   . Palpitations     a. 48 hr Holter 02/2015: NSR, rare PACs and PVCs. Otherwise, no significant arrhythmia   . Atypical chest pain   . Obese     Patient Active Problem List   Diagnosis Date Noted  . Hypothyroid   . Atypical chest pain   . Obese   . Chest pressure 02/16/2015  . Palpitations 02/16/2015    Past Surgical History  Procedure Laterality Date  . Breast reduction surgery    . Cesarean section      Current Outpatient Rx  Name  Route  Sig  Dispense  Refill  . aspirin EC 81 MG tablet   Oral   Take 81 mg by mouth daily.         . hydrochlorothiazide (MICROZIDE) 12.5 MG capsule   Oral   Take 1 capsule (12.5 mg total) by mouth daily.   90 capsule   3   . levothyroxine (SYNTHROID, LEVOTHROID) 112 MCG tablet   Oral   Take 112 mcg by mouth daily before breakfast.         . metoprolol tartrate (LOPRESSOR) 25 MG tablet   Oral   Take 25 mg by  mouth 2 (two) times daily.         . ranitidine (ZANTAC) 150 MG tablet   Oral   Take 1 tablet (150 mg total) by mouth at bedtime.   30 tablet   1   . traMADol (ULTRAM) 50 MG tablet   Oral   Take 1 tablet (50 mg total) by mouth 2 (two) times daily.   15 tablet   0    Allergies Review of patient's allergies indicates no known allergies.  Family History  Problem Relation Age of Onset  . Acute myelogenous leukemia Mother   . COPD Mother   . Heart Problems Mother   . Hypertension Mother   . Hypothyroidism Mother   . Stroke Mother   . Stroke Father   . Epilepsy Brother   . Cancer Paternal Aunt   . Heart Problems Paternal Grandmother   . Heart Problems Paternal Grandfather    Social History Social History  Substance Use Topics  . Smoking status: Never Smoker   . Smokeless tobacco: Never Used  . Alcohol Use: No   Review of Systems  Constitutional: Negative for fever. Eyes: Negative for visual changes. ENT: Negative for sore throat. Cardiovascular: Negative for chest pain. Respiratory: Negative for shortness of breath. Gastrointestinal: Negative  for abdominal pain, vomiting and diarrhea. Genitourinary: Negative for dysuria. Musculoskeletal: Negative for back pain. Left knee pain as above. Skin: Negative for rash. Neurological: Negative for headaches, focal weakness or numbness. ____________________________________________  PHYSICAL EXAM:  VITAL SIGNS: ED Triage Vitals  Enc Vitals Group     BP 04/23/15 2043 126/55 mmHg     Pulse Rate 04/23/15 2043 91     Resp 04/23/15 2043 18     Temp 04/23/15 2043 97.8 F (36.6 C)     Temp Source 04/23/15 2043 Oral     SpO2 04/23/15 2043 96 %     Weight 04/23/15 2041 192 lb (87.091 kg)     Height 04/23/15 2041 5\' 3"  (1.6 m)     Head Cir --      Peak Flow --      Pain Score 04/23/15 2041 3     Pain Loc --      Pain Edu? --      Excl. in [place]? --    Constitutional: Alert and oriented. Well appearing and in no  distress. Head: Normocephalic and atraumatic.      Eyes: Conjunctivae are normal. PERRL. Normal extraocular movements      Ears: Canals clear. TMs intact bilaterally.   Nose: No congestion/rhinorrhea.   Mouth/Throat: Mucous membranes are moist.   Neck: Supple. No thyromegaly. Hematological/Lymphatic/Immunological: No cervical lymphadenopathy. Cardiovascular: Normal rate, regular rhythm.  Respiratory: Normal respiratory effort. No wheezes/rales/rhonchi. Gastrointestinal: Soft and nontender. No distention. Musculoskeletal: Left knee without any obvious deformity, effusion, edema, or dislocation. Patient with normal patellar tracking on exam. She is tender to palpation over the medial aspect of the knee at the insertion of the medial collateral ligament, distally. It is no appreciable patella ballottement and the patella tracks normally with flexion and extension range. No valgus or varus joint stress is appreciated negative anterior-posterior drawer. Negative Lachman and McMurray maneuvers. Nontender with normal range of motion in all extremities.  Neurologic:  Normal gait without ataxia. Normal speech and language. No gross focal neurologic deficits are appreciated. Skin:  Skin is warm, dry and intact. No rash noted. Psychiatric: Mood and affect are normal. Patient exhibits appropriate insight and judgment. ____________________________________________   RADIOLOGY Left Knee IMPRESSION: No evidence of fracture or dislocation. Small knee joint effusion Noted.  I, Malikai Gut, Dannielle Karvonen, personally viewed and evaluated these images (plain radiographs) as part of my medical decision making, as well as reviewing the written report by the radiologist. ____________________________________________  PROCEDURES  Knee immobilizer Ultram 100 mg PO ____________________________________________  INITIAL IMPRESSION / ASSESSMENT AND PLAN / ED COURSE  Left knee strain with minimal effusion.  Patient placed in the immobilizer for comfort. She is encouraged to rest and ice the knee when seated. She'll follow with Dr. Marry Guan for ongoing symptoms. A prescription for Ultram #15 is provided. ____________________________________________  FINAL CLINICAL IMPRESSION(S) / ED DIAGNOSES  Final diagnoses:  Knee strain, left, initial encounter      Melvenia Needles, PA-C 04/24/15 0002  Ahmed Prima, MD 04/24/15 2347

## 2015-04-23 NOTE — ED Notes (Signed)
Knee brace applied

## 2015-04-23 NOTE — ED Notes (Signed)

## 2015-04-30 ENCOUNTER — Other Ambulatory Visit: Payer: Self-pay | Admitting: Orthopedic Surgery

## 2015-04-30 DIAGNOSIS — M25462 Effusion, left knee: Secondary | ICD-10-CM

## 2015-05-08 ENCOUNTER — Ambulatory Visit: Payer: BLUE CROSS/BLUE SHIELD | Attending: Pulmonary Disease

## 2015-05-08 DIAGNOSIS — G4710 Hypersomnia, unspecified: Secondary | ICD-10-CM | POA: Diagnosis present

## 2015-05-08 DIAGNOSIS — R0683 Snoring: Secondary | ICD-10-CM | POA: Diagnosis not present

## 2015-05-09 ENCOUNTER — Other Ambulatory Visit: Payer: BLUE CROSS/BLUE SHIELD

## 2015-05-09 ENCOUNTER — Encounter: Payer: Self-pay | Admitting: *Deleted

## 2015-05-09 NOTE — Patient Instructions (Signed)
  Your procedure is scheduled on: 05-10-15  Report to [place]. To find out your arrival time please call [phone] between 1PM - 3PM on 05-09-15  Remember: Instructions that are not followed completely may result in serious medical risk, up to and including death, or upon the discretion of your surgeon and anesthesiologist your surgery may need to be rescheduled.    _X___ 1. Do not eat food or drink liquids after midnight. No gum chewing or hard candies.     _X___ 2. No Alcohol for 24 hours before or after surgery.   ____ 3. Bring all medications with you on the day of surgery if instructed.    ____ 4. Notify your doctor if there is any change in your medical condition     (cold, fever, infections).     Do not wear jewelry, make-up, hairpins, clips or nail polish.  Do not wear lotions, powders, or perfumes. You may wear deodorant.  Do not shave 48 hours prior to surgery. Men may shave face and neck.  Do not bring valuables to the hospital.    [facility] is not responsible for any belongings or valuables.               Contacts, dentures or bridgework may not be worn into surgery.  Leave your suitcase in the car. After surgery it may be brought to your room.  For patients admitted to the hospital, discharge time is determined by your treatment team.   Patients discharged the day of surgery will not be allowed to drive home.   Please read over the following fact sheets that you were given:      _X___ Take these medicines the morning of surgery with A SIP OF WATER:    1. LEVOTHYROXINE  2. METOPROLOL  3. ZANTAC  4. TAKE A ZANTAC TONIGHT BEFORE BED  5.  6.  ____ Fleet Enema (as directed)   ____ Use CHG Soap as directed  ____ Use inhalers on the day of surgery  ____ Stop metformin 2 days prior to surgery    ____ Take 1/2 of usual insulin dose the night before surgery and none on the morning of surgery.   ____ Stop  Coumadin/Plavix/aspirin-N/A  _X___ Stop Anti-inflammatories-PT STOPPED IBUPROFEN ON 05-07-15- NO NSAIDS OR ASA PRODUCTS-TYLENOL OK TO TAKE   ____ Stop supplements until after surgery.    ____ Bring C-Pap to the hospital.

## 2015-05-10 ENCOUNTER — Ambulatory Visit
Admission: RE | Admit: 2015-05-10 | Discharge: 2015-05-10 | Disposition: A | Discharge status: Home / Self Care | Payer: BLUE CROSS/BLUE SHIELD | Source: Ambulatory Visit | Attending: Orthopedic Surgery | Admitting: Orthopedic Surgery

## 2015-05-10 ENCOUNTER — Encounter
Admission: RE | Disposition: A | Discharge status: Home / Self Care | Payer: Self-pay | Source: Ambulatory Visit | Attending: Orthopedic Surgery

## 2015-05-10 ENCOUNTER — Ambulatory Visit: Payer: BLUE CROSS/BLUE SHIELD | Admitting: Anesthesiology

## 2015-05-10 DIAGNOSIS — E039 Hypothyroidism, unspecified: Secondary | ICD-10-CM | POA: Diagnosis not present

## 2015-05-10 DIAGNOSIS — X503XXA Overexertion from repetitive movements, initial encounter: Secondary | ICD-10-CM | POA: Insufficient documentation

## 2015-05-10 DIAGNOSIS — I10 Essential (primary) hypertension: Secondary | ICD-10-CM | POA: Diagnosis not present

## 2015-05-10 DIAGNOSIS — E785 Hyperlipidemia, unspecified: Secondary | ICD-10-CM | POA: Insufficient documentation

## 2015-05-10 DIAGNOSIS — Z888 Allergy status to other drugs, medicaments and biological substances status: Secondary | ICD-10-CM | POA: Diagnosis not present

## 2015-05-10 DIAGNOSIS — S83232A Complex tear of medial meniscus, current injury, left knee, initial encounter: Secondary | ICD-10-CM | POA: Diagnosis not present

## 2015-05-10 DIAGNOSIS — D649 Anemia, unspecified: Secondary | ICD-10-CM | POA: Insufficient documentation

## 2015-05-10 DIAGNOSIS — Z823 Family history of stroke: Secondary | ICD-10-CM | POA: Diagnosis not present

## 2015-05-10 DIAGNOSIS — F419 Anxiety disorder, unspecified: Secondary | ICD-10-CM | POA: Insufficient documentation

## 2015-05-10 DIAGNOSIS — E669 Obesity, unspecified: Secondary | ICD-10-CM | POA: Insufficient documentation

## 2015-05-10 DIAGNOSIS — Z806 Family history of leukemia: Secondary | ICD-10-CM | POA: Insufficient documentation

## 2015-05-10 DIAGNOSIS — Y9389 Activity, other specified: Secondary | ICD-10-CM | POA: Insufficient documentation

## 2015-05-10 DIAGNOSIS — Z6834 Body mass index (BMI) 34.0-34.9, adult: Secondary | ICD-10-CM | POA: Diagnosis not present

## 2015-05-10 DIAGNOSIS — Y998 Other external cause status: Secondary | ICD-10-CM | POA: Diagnosis not present

## 2015-05-10 DIAGNOSIS — Z79899 Other long term (current) drug therapy: Secondary | ICD-10-CM | POA: Insufficient documentation

## 2015-05-10 DIAGNOSIS — Z7982 Long term (current) use of aspirin: Secondary | ICD-10-CM | POA: Diagnosis not present

## 2015-05-10 DIAGNOSIS — X509XXA Other and unspecified overexertion or strenuous movements or postures, initial encounter: Secondary | ICD-10-CM | POA: Insufficient documentation

## 2015-05-10 DIAGNOSIS — Z8249 Family history of ischemic heart disease and other diseases of the circulatory system: Secondary | ICD-10-CM | POA: Insufficient documentation

## 2015-05-10 DIAGNOSIS — Z825 Family history of asthma and other chronic lower respiratory diseases: Secondary | ICD-10-CM | POA: Insufficient documentation

## 2015-05-10 DIAGNOSIS — Z8349 Family history of other endocrine, nutritional and metabolic diseases: Secondary | ICD-10-CM | POA: Insufficient documentation

## 2015-05-10 DIAGNOSIS — X500XXA Overexertion from strenuous movement or load, initial encounter: Secondary | ICD-10-CM | POA: Diagnosis not present

## 2015-05-10 HISTORY — DX: Anxiety disorder, unspecified: F41.9

## 2015-05-10 HISTORY — DX: Reserved for inherently not codable concepts without codable children: IMO0001

## 2015-05-10 HISTORY — DX: Anemia, unspecified: D64.9

## 2015-05-10 HISTORY — DX: Cardiac arrhythmia, unspecified: I49.9

## 2015-05-10 HISTORY — PX: KNEE ARTHROSCOPY: SHX127

## 2015-05-10 HISTORY — DX: Gastro-esophageal reflux disease without esophagitis: K21.9

## 2015-05-10 SURGERY — ARTHROSCOPY, KNEE
Anesthesia: General | Laterality: Left | Wound class: Clean

## 2015-05-10 MED ORDER — LACTATED RINGERS IV SOLN
INTRAVENOUS | Status: DC
  Administered 2015-05-10 (×2): via INTRAVENOUS

## 2015-05-10 MED ORDER — ACETAMINOPHEN 10 MG/ML IV SOLN
INTRAVENOUS | Status: AC
  Filled 2015-05-10: qty 100

## 2015-05-10 MED ORDER — LIDOCAINE HCL (CARDIAC) 20 MG/ML IV SOLN
INTRAVENOUS | Status: DC | PRN
  Administered 2015-05-10: 80 mg via INTRAVENOUS

## 2015-05-10 MED ORDER — ACETAMINOPHEN 10 MG/ML IV SOLN
INTRAVENOUS | Status: DC | PRN
  Administered 2015-05-10: 1000 mg via INTRAVENOUS

## 2015-05-10 MED ORDER — BUPIVACAINE-EPINEPHRINE (PF) 0.5% -1:200000 IJ SOLN
INTRAMUSCULAR | Status: DC | PRN
  Administered 2015-05-10: 20 mL

## 2015-05-10 MED ORDER — PROMETHAZINE HCL 25 MG/ML IJ SOLN
INTRAMUSCULAR | Status: AC
  Filled 2015-05-10: qty 1

## 2015-05-10 MED ORDER — PROPOFOL 10 MG/ML IV BOLUS
INTRAVENOUS | Status: DC | PRN
  Administered 2015-05-10: 150 mg via INTRAVENOUS

## 2015-05-10 MED ORDER — ONDANSETRON HCL 4 MG/2ML IJ SOLN
4.0000 mg | Freq: Once | INTRAMUSCULAR | Status: AC | PRN
  Administered 2015-05-10: 4 mg via INTRAVENOUS

## 2015-05-10 MED ORDER — ONDANSETRON HCL 4 MG/2ML IJ SOLN
INTRAMUSCULAR | Status: AC
  Filled 2015-05-10: qty 2

## 2015-05-10 MED ORDER — FENTANYL CITRATE (PF) 100 MCG/2ML IJ SOLN
INTRAMUSCULAR | Status: AC
  Filled 2015-05-10: qty 2

## 2015-05-10 MED ORDER — PROMETHAZINE HCL 25 MG/ML IJ SOLN
12.5000 mg | Freq: Once | INTRAMUSCULAR | Status: AC
  Administered 2015-05-10: 12.5 mg via INTRAVENOUS

## 2015-05-10 MED ORDER — HYDROMORPHONE HCL 1 MG/ML IJ SOLN
0.5000 mg | INTRAMUSCULAR | Status: DC | PRN
  Administered 2015-05-10: 0.5 mg via INTRAVENOUS

## 2015-05-10 MED ORDER — EPHEDRINE SULFATE 50 MG/ML IJ SOLN
INTRAMUSCULAR | Status: DC | PRN
  Administered 2015-05-10: 10 mg via INTRAVENOUS

## 2015-05-10 MED ORDER — MIDAZOLAM HCL 2 MG/2ML IJ SOLN: 2.0000 mg | Freq: Once | INTRAMUSCULAR | Status: DC

## 2015-05-10 MED ORDER — HYDROCODONE-ACETAMINOPHEN 5-325 MG PO TABS: 1.0000 | ORAL_TABLET | Freq: Four times a day (QID) | ORAL | Status: DC | PRN

## 2015-05-10 MED ORDER — ONDANSETRON HCL 4 MG/2ML IJ SOLN
INTRAMUSCULAR | Status: DC | PRN
  Administered 2015-05-10: 4 mg via INTRAVENOUS

## 2015-05-10 MED ORDER — MIDAZOLAM HCL 2 MG/2ML IJ SOLN
INTRAMUSCULAR | Status: AC
  Administered 2015-05-10: 2 mg via INTRAVENOUS
  Filled 2015-05-10: qty 2

## 2015-05-10 MED ORDER — FENTANYL CITRATE (PF) 100 MCG/2ML IJ SOLN
INTRAMUSCULAR | Status: DC | PRN
  Administered 2015-05-10: 50 ug via INTRAVENOUS

## 2015-05-10 MED ORDER — DEXAMETHASONE SODIUM PHOSPHATE 10 MG/ML IJ SOLN
INTRAMUSCULAR | Status: DC | PRN
  Administered 2015-05-10: 8 mg via INTRAVENOUS

## 2015-05-10 MED ORDER — HYDROMORPHONE HCL 1 MG/ML IJ SOLN
INTRAMUSCULAR | Status: DC
Start: 2015-05-10 — End: 2015-05-10
  Filled 2015-05-10: qty 1

## 2015-05-10 MED ORDER — BUPIVACAINE-EPINEPHRINE (PF) 0.5% -1:200000 IJ SOLN
INTRAMUSCULAR | Status: AC
  Filled 2015-05-10: qty 30

## 2015-05-10 MED ORDER — FENTANYL CITRATE (PF) 100 MCG/2ML IJ SOLN
25.0000 ug | INTRAMUSCULAR | Status: AC | PRN
  Administered 2015-05-10 (×6): 25 ug via INTRAVENOUS

## 2015-05-10 SURGICAL SUPPLY — 29 items
BANDAGE ACE 4X5 VEL STRL LF (GAUZE/BANDAGES/DRESSINGS) ×2 IMPLANT
BANDAGE ELASTIC 4 LF NS (GAUZE/BANDAGES/DRESSINGS) ×2 IMPLANT
BLADE FULL RADIUS 3.5 (BLADE) ×1 IMPLANT
BLADE INCISOR PLUS 4.5 (BLADE) ×1 IMPLANT
BLADE SHAVER 4.5 DBL SERAT CV (CUTTER) ×2 IMPLANT
BLADE SHAVER 4.5X7 STR FR (MISCELLANEOUS) ×1 IMPLANT
BNDG CMPR MED 5X4 ELC HKLP NS (GAUZE/BANDAGES/DRESSINGS) ×1
CHLORAPREP W/TINT 26ML (MISCELLANEOUS) ×2 IMPLANT
CUTTER AGGRESSIVE+ 3.5 (CUTTER) ×2 IMPLANT
GAUZE PETRO XEROFOAM 1X8 (MISCELLANEOUS) ×2 IMPLANT
GAUZE SPONGE 4X4 12PLY STRL (GAUZE/BANDAGES/DRESSINGS) ×2 IMPLANT
GLOVE BIOGEL PI IND STRL 9 (GLOVE) ×1 IMPLANT
GLOVE BIOGEL PI INDICATOR 9 (GLOVE) ×1
GLOVE SURG ORTHO 9.0 STRL STRW (GLOVE) ×2 IMPLANT
GOWN SPECIALTY ULTRA XL (MISCELLANEOUS) ×2 IMPLANT
GOWN STRL REUS W/ TWL LRG LVL3 (GOWN DISPOSABLE) ×1 IMPLANT
GOWN STRL REUS W/TWL LRG LVL3 (GOWN DISPOSABLE) ×2
IV LACTATED RINGER IRRG 3000ML (IV SOLUTION) ×8
IV LR IRRIG 3000ML ARTHROMATIC (IV SOLUTION) ×4 IMPLANT
KIT RM TURNOVER STRD PROC AR (KITS) ×2 IMPLANT
MANIFOLD NEPTUNE II (INSTRUMENTS) ×2 IMPLANT
PACK ARTHROSCOPY KNEE (MISCELLANEOUS) ×2 IMPLANT
SET TUBE SUCT SHAVER OUTFL 24K (TUBING) ×2 IMPLANT
SET TUBE TIP INTRA-ARTICULAR (MISCELLANEOUS) ×2 IMPLANT
SUT ETHILON 4-0 (SUTURE)
SUT ETHILON 4-0 FS2 18XMFL BLK (SUTURE)
SUTURE ETHLN 4-0 FS2 18XMF BLK (SUTURE) ×1 IMPLANT
TUBING ARTHRO INFLOW-ONLY STRL (TUBING) ×2 IMPLANT
WAND HAND CNTRL MULTIVAC 50 (MISCELLANEOUS) ×2 IMPLANT

## 2015-05-10 NOTE — Anesthesia Preprocedure Evaluation (Signed)
Anesthesia Evaluation  Patient identified by MRN, date of birth, ID band  Reviewed: Allergy & Precautions, NPO status , Unable to perform ROS - Chart review only  Airway Mallampati: II       Dental  (+) Teeth Intact   Pulmonary    breath sounds clear to auscultation       Cardiovascular hypertension, Pt. on medications and Pt. on home beta blockers  Rhythm:Regular Rate:Normal     Neuro/Psych Anxiety    GI/Hepatic Neg liver ROS, GERD  Medicated,  Endo/Other  Hypothyroidism   Renal/GU      Musculoskeletal   Abdominal Normal abdominal exam  (+)   Peds  Hematology  (+) anemia ,   Anesthesia Other Findings   Reproductive/Obstetrics                             Anesthesia Physical Anesthesia Plan  ASA: II  Anesthesia Plan: General   Post-op Pain Management:    Induction: Intravenous  Airway Management Planned: LMA  Additional Equipment:   Intra-op Plan:   Post-operative Plan: Extubation in OR  Informed Consent: I have reviewed the patients History and Physical, chart, labs and discussed the procedure including the risks, benefits and alternatives for the proposed anesthesia with the patient or authorized representative who has indicated his/her understanding and acceptance.     Plan Discussed with: CRNA  Anesthesia Plan Comments:         Anesthesia Quick Evaluation

## 2015-05-10 NOTE — Op Note (Signed)
05/10/2015  2:37 PM  PATIENT:  Kimberly Rivas  50 y.o. female  PRE-OPERATIVE DIAGNOSIS:  COMPLETE TEAR MEDIAL MENISCUS  POST-OPERATIVE DIAGNOSIS:  COMPLETE TEAR MEDIAL MENISCUS  PROCEDURE:  Procedure(s): ARTHROSCOPY KNEE, partial medial meniscectomy (Left)  SURGEON: Laurene Footman, MD  ASSISTANTS: None  ANESTHESIA:   general  EBL:     BLOOD ADMINISTERED:none  DRAINS: none   LOCAL MEDICATIONS USED:  MARCAINE     SPECIMEN:  No Specimen  DISPOSITION OF SPECIMEN:  N/A  COUNTS:  YES  TOURNIQUET:    IMPLANTS: None  DICTATION: .Dragon Dictation patient brought the operating room and after adequate anesthesia was obtained the left leg was placed in the arthroscopic leg holder and prepped and draped in usual sterile fashion. After patient identification and timeout procedures were completed an inferior lateral portal was made and the arthroscope was introduced. 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 The gutters were free of any loose bodies, the medial compartment inferior medial portal was made and on inspection and probing there is a complex tear of the posterior horn of medial meniscus appeared to be a bucket-handle tear that had torn in the midsubstance slur to flaps from the very posterior medial aspect of of the meniscus and posterior lateral aspect of the meniscus this was subsequently debrided with meniscal punch ArthriCare wand and shaver to get back to stable margin resecting most the posterior horn. The anterior cruciate ligament was intact the articular cartilage and medial compartment was relatively normal with just a few areas of fibrillation. The lateral compartment was normal with normal meniscus and articular cartilage after addressing the meniscal pathology the knee was irrigated until clear the knee was then infiltrated with 20 cc half percent  Sensorcaine there the portals and dressed with Xeroform 4 x 4's web roll and Ace wrap  PLAN OF CARE: Discharge to home after PACU  PATIENT DISPOSITION:  PACU - hemodynamically stable.

## 2015-05-10 NOTE — Discharge Instructions (Addendum)
Weightbearing as tolerated on left leg. Leave dressing in place keep clean and dry. If bandage slides down remove bandage apply 2 Band-Aids and rewrap with Ace wrap     AMBULATORY SURGERY  DISCHARGE INSTRUCTIONS   1) The drugs that you were given will stay in your system until tomorrow so for the next 24 hours you should not:  A) Drive an automobile B) Make any legal decisions C) Drink any alcoholic beverage   2) You may resume regular meals tomorrow.  Today it is better to start with liquids and gradually work up to solid foods.  You may eat anything you prefer, but it is better to start with liquids, then soup and crackers, and gradually work up to solid foods.   3) Please notify your doctor immediately if you have any unusual bleeding, trouble breathing, redness and pain at the surgery site, drainage, fever, or pain not relieved by medication. 4)   5) Your post-operative visit with Dr.                                     is: Date:                        Time:    Please call to schedule your post-operative visit.  6) Additional Instructions:

## 2015-05-10 NOTE — Anesthesia Procedure Notes (Signed)
Procedure Name: LMA Insertion Date/Time: 05/10/2015 1:45 PM Performed by: Delaney Meigs Pre-anesthesia Checklist: Patient identified, Emergency Drugs available, Suction available, Patient being monitored and Timeout performed Patient Re-evaluated:Patient Re-evaluated prior to inductionOxygen Delivery Method: Circle system utilized and Simple face mask Preoxygenation: Pre-oxygenation with 100% oxygen Intubation Type: IV induction Ventilation: Mask ventilation without difficulty LMA Size: 3.5 Number of attempts: 1 Placement Confirmation: breath sounds checked- equal and bilateral and positive ETCO2 Tube secured with: Tape

## 2015-05-10 NOTE — Transfer of Care (Signed)
Immediate Anesthesia Transfer of Care Note  Patient: Kimberly Rivas  Procedure(s) Performed: Procedure(s): ARTHROSCOPY KNEE, partial medial meniscectomy (Left)  Patient Location: PACU  Anesthesia Type:General  Level of Consciousness: sedated  Airway & Oxygen Therapy: Patient Spontanous Breathing and Patient connected to face mask oxygen  Post-op Assessment: Report given to RN and Post -op Vital signs reviewed and stable  Post vital signs: Reviewed and stable  Last Vitals: 1443 - 124/77 98% sat 91 hr 18resp Filed Vitals:   05/10/15 1106  BP: 120/73  Pulse: 67  Temp: 36.7 C  Resp: 16    Complications: No apparent anesthesia complications

## 2015-05-10 NOTE — H&P (Signed)
Reviewed paper H+P, will be scanned into chart. No changes noted.  

## 2015-05-10 NOTE — Anesthesia Postprocedure Evaluation (Signed)
Anesthesia Post Note  Patient: Kimberly Rivas  Procedure(s) Performed: Procedure(s) (LRB): ARTHROSCOPY KNEE, partial medial meniscectomy (Left)  Patient location during evaluation: PACU Anesthesia Type: General Level of consciousness: awake Pain management: pain level controlled Vital Signs Assessment: post-procedure vital signs reviewed and stable Respiratory status: spontaneous breathing Cardiovascular status: stable Anesthetic complications: no    Last Vitals:  Filed Vitals:   05/10/15 1442 05/10/15 1445  BP: 124/77 124/77  Pulse: 91 92  Temp: 36.1 C   Resp: 17 17    Last Pain:  Filed Vitals:   05/10/15 1502  PainSc: 6                  VAN STAVEREN,Siris Hoos

## 2015-05-11 ENCOUNTER — Encounter: Payer: Self-pay | Admitting: Orthopedic Surgery

## 2015-05-11 DIAGNOSIS — R0683 Snoring: Secondary | ICD-10-CM | POA: Diagnosis not present

## 2015-05-13 ENCOUNTER — Emergency Department
Admission: EM | Admit: 2015-05-13 | Discharge: 2015-05-13 | Disposition: A | Discharge status: Home / Self Care | Payer: BLUE CROSS/BLUE SHIELD | Attending: Emergency Medicine | Admitting: Emergency Medicine

## 2015-05-13 ENCOUNTER — Encounter: Payer: Self-pay | Admitting: Emergency Medicine

## 2015-05-13 ENCOUNTER — Emergency Department: Payer: BLUE CROSS/BLUE SHIELD

## 2015-05-13 DIAGNOSIS — G8918 Other acute postprocedural pain: Secondary | ICD-10-CM | POA: Insufficient documentation

## 2015-05-13 DIAGNOSIS — R2242 Localized swelling, mass and lump, left lower limb: Secondary | ICD-10-CM | POA: Diagnosis not present

## 2015-05-13 DIAGNOSIS — Z79899 Other long term (current) drug therapy: Secondary | ICD-10-CM | POA: Diagnosis not present

## 2015-05-13 DIAGNOSIS — I10 Essential (primary) hypertension: Secondary | ICD-10-CM | POA: Insufficient documentation

## 2015-05-13 DIAGNOSIS — R0789 Other chest pain: Secondary | ICD-10-CM

## 2015-05-13 DIAGNOSIS — Z7982 Long term (current) use of aspirin: Secondary | ICD-10-CM | POA: Insufficient documentation

## 2015-05-13 DIAGNOSIS — M79602 Pain in left arm: Secondary | ICD-10-CM | POA: Diagnosis not present

## 2015-05-13 DIAGNOSIS — M25562 Pain in left knee: Secondary | ICD-10-CM | POA: Diagnosis not present

## 2015-05-13 DIAGNOSIS — R079 Chest pain, unspecified: Secondary | ICD-10-CM | POA: Diagnosis present

## 2015-05-13 LAB — CBC WITH DIFFERENTIAL/PLATELET
Basophils Absolute: 0.1 10*3/uL (ref 0–0.1)
Basophils Relative: 1 %
EOS ABS: 0.6 10*3/uL (ref 0–0.7)
EOS PCT: 5 %
HCT: 40.4 % (ref 35.0–47.0)
HEMOGLOBIN: 13.8 g/dL (ref 12.0–16.0)
LYMPHS ABS: 3.2 10*3/uL (ref 1.0–3.6)
LYMPHS PCT: 28 %
MCH: 31.9 pg (ref 26.0–34.0)
MCHC: 34.1 g/dL (ref 32.0–36.0)
MCV: 93.4 fL (ref 80.0–100.0)
MONOS PCT: 8 %
Monocytes Absolute: 0.9 10*3/uL (ref 0.2–0.9)
NEUTROS ABS: 6.5 10*3/uL (ref 1.4–6.5)
NEUTROS PCT: 58 %
PLATELETS: 323 10*3/uL (ref 150–440)
RBC: 4.32 MIL/uL (ref 3.80–5.20)
RDW: 11.7 % (ref 11.5–14.5)
WBC: 11.3 10*3/uL — ABNORMAL HIGH (ref 3.6–11.0)

## 2015-05-13 LAB — COMPREHENSIVE METABOLIC PANEL
ALK PHOS: 83 U/L (ref 38–126)
ALT: 39 U/L (ref 14–54)
ANION GAP: 10 (ref 5–15)
AST: 30 U/L (ref 15–41)
Albumin: 4.3 g/dL (ref 3.5–5.0)
BUN: 26 mg/dL — ABNORMAL HIGH (ref 6–20)
CALCIUM: 9.6 mg/dL (ref 8.9–10.3)
CO2: 25 mmol/L (ref 22–32)
CREATININE: 0.82 mg/dL (ref 0.44–1.00)
Chloride: 104 mmol/L (ref 101–111)
Glucose, Bld: 115 mg/dL — ABNORMAL HIGH (ref 65–99)
Potassium: 4 mmol/L (ref 3.5–5.1)
SODIUM: 139 mmol/L (ref 135–145)
TOTAL PROTEIN: 8 g/dL (ref 6.5–8.1)
Total Bilirubin: 0.4 mg/dL (ref 0.3–1.2)

## 2015-05-13 LAB — LIPASE, BLOOD: LIPASE: 32 U/L (ref 11–51)

## 2015-05-13 LAB — TROPONIN I

## 2015-05-13 MED ORDER — FAMOTIDINE IN NACL 20-0.9 MG/50ML-% IV SOLN: 20.0000 mg | Freq: Once | INTRAVENOUS | Status: DC

## 2015-05-13 MED ORDER — ESOMEPRAZOLE MAGNESIUM 20 MG PO CPDR: 20.0000 mg | DELAYED_RELEASE_CAPSULE | Freq: Every day | ORAL | Status: DC

## 2015-05-13 MED ORDER — FAMOTIDINE 20 MG PO TABS: 20.0000 mg | ORAL_TABLET | Freq: Two times a day (BID) | ORAL | Status: DC | PRN

## 2015-05-13 MED ORDER — GI COCKTAIL ~~LOC~~
30.0000 mL | Freq: Once | ORAL | Status: AC
  Administered 2015-05-13: 30 mL via ORAL
  Filled 2015-05-13: qty 30

## 2015-05-13 MED ORDER — FAMOTIDINE 20 MG PO TABS
20.0000 mg | ORAL_TABLET | Freq: Once | ORAL | Status: AC
  Administered 2015-05-13: 20 mg via ORAL
  Filled 2015-05-13: qty 1

## 2015-05-13 NOTE — ED Notes (Signed)
Pt found in room att 

## 2015-05-13 NOTE — ED Notes (Signed)
Pt reports surgery meniscus repair on Thursday, today burning sensation and tingling sensation to chest area, left arm, jaw. Pt reports not taking pain medication, reports taking 600mg  of Ibuprofen every 6 hrs.

## 2015-05-13 NOTE — Discharge Instructions (Signed)
Take nexium daily.  Take pepcid twice daily as needed.  Continue zantac.   See your primary doctor and heart doctor   Return to ER if you have worse chest pain, nausea, vomiting, shortness of breath.

## 2015-05-13 NOTE — ED Provider Notes (Addendum)
CSN: [id number]     Arrival date & time 05/13/15  1834 History   First MD Initiated Contact with Patient 05/13/15 1847     Chief Complaint  Patient presents with  . Chest Pain    Burning sensation to Chest  . Tingling    to left arm and jaw      (Consider location/radiation/quality/duration/timing/severity/associated sxs/prior Treatment) The history is provided by the patient.  Kimberly Rivas is a 50 y.o. female hx of hypothyroidism, HTN, HL, recent L meniscus repair here with chest burning sensation, left arm pain. Patient states that she had L arthroscopy 3 days ago and was discharged the same day. Since yesterday, she felt nauseated and had some chest pain that is constant and radiate to the jaw and left arm. Denies back pain or shortness of breath. Denies abdominal pain. Denies any fevers. She states that she has some left knee pain and swelling but is able to move her knee.    Past Medical History  Diagnosis Date  . Hypothyroid     a. dx'd at age 50  . Hypertension   . HLD (hyperlipidemia)   . Palpitations     a. 48 hr Holter 02/2015: NSR, rare PACs and PVCs. Otherwise, no significant arrhythmia   . Atypical chest pain   . Obese   . Dysrhythmia   . Anxiety   . GERD (gastroesophageal reflux disease)   . Anemia     H/O  . Shortness of breath dyspnea     RELATED TO ANXIETY PER PT-PT STATES SHE CAN WALK A MILE WITHOUT GETTING SOB (05-09-15)PT IS SEEING DR Mortimer Fries FOR A PULMONARY NODULE SEEN ON CT   Past Surgical History  Procedure Laterality Date  . Breast reduction surgery    . Cesarean section      X3  . Colonoscopy    . Knee arthroscopy Left 05/10/2015    Procedure: ARTHROSCOPY KNEE, partial medial meniscectomy;  Surgeon: Hessie Knows, MD;  Location: [facility] ORS;  Service: Orthopedics;  Laterality: Left;   Family History  Problem Relation Age of Onset  . Acute myelogenous leukemia Mother   . COPD Mother   . Heart Problems Mother   . Hypertension Mother   . Hypothyroidism  Mother   . Stroke Mother   . Stroke Father   . Epilepsy Brother   . Cancer Paternal Aunt   . Heart Problems Paternal Grandmother   . Heart Problems Paternal Grandfather    Social History  Substance Use Topics  . Smoking status: Never Smoker   . Smokeless tobacco: Never Used  . Alcohol Use: No   OB History    No data available     Review of Systems  Cardiovascular: Positive for chest pain.  All other systems reviewed and are negative.     Allergies  Tramadol  Home Medications   Prior to Admission medications   Medication Sig Start Date End Date Taking? Authorizing Provider  aspirin EC 81 MG tablet Take 81 mg by mouth daily.   Yes Historical Provider, MD  hydrochlorothiazide (MICROZIDE) 12.5 MG capsule Take 1 capsule (12.5 mg total) by mouth daily. 04/13/15  Yes Minna Merritts, MD  ibuprofen (ADVIL,MOTRIN) 200 MG tablet Take 600 mg by mouth every 6 (six) hours as needed for mild pain or moderate pain.    Yes Historical Provider, MD  levothyroxine (SYNTHROID, LEVOTHROID) 112 MCG tablet Take 112 mcg by mouth daily before breakfast.   Yes Historical Provider, MD  metoprolol tartrate (  LOPRESSOR) 25 MG tablet Take 25 mg by mouth 2 (two) times daily.   Yes Historical Provider, MD  ranitidine (ZANTAC) 150 MG tablet Take 1 tablet (150 mg total) by mouth at bedtime. Patient taking differently: Take 150 mg by mouth as needed.  11/11/14 11/11/15 Yes Earleen Newport, MD  HYDROcodone-acetaminophen (NORCO) 5-325 MG tablet Take 1 tablet by mouth every 6 (six) hours as needed for moderate pain. 05/10/15   Hessie Knows, MD   BP 139/87 mmHg  Pulse 62  Temp(Src) 98.3 F (36.8 C) (Oral)  Resp 18  Ht 5\' 3"  (1.6 m)  Wt 192 lb (87.091 kg)  BMI 34.02 kg/m2  SpO2 95% Physical Exam  Constitutional: She is oriented to person, place, and time. She appears well-developed and well-nourished.  HENT:  Head: Normocephalic.  Mouth/Throat: Oropharynx is clear and moist.  Eyes: Pupils are equal,  round, and reactive to light.  Neck: Normal range of motion. Neck supple.  Cardiovascular: Normal rate, regular rhythm and normal heart sounds.   Pulmonary/Chest: Effort normal and breath sounds normal. No respiratory distress. She has no wheezes. She has no rales.  No reproducible tenderness.   Abdominal: Soft. Bowel sounds are normal. She exhibits no distension. There is no tenderness. There is no rebound.  Musculoskeletal: Normal range of motion.  L knee slightly swollen, expected postop. Able to range it. Surgical site healing well, no purulent drainage   Neurological: She is alert and oriented to person, place, and time.  Skin: Skin is warm and dry.  Psychiatric: She has a normal mood and affect. Her behavior is normal. Judgment and thought content normal.  Nursing note and vitals reviewed.   ED Course  Procedures (including critical care time) Labs Review Labs Reviewed  CBC WITH DIFFERENTIAL/PLATELET - Abnormal; Notable for the following:    WBC 11.3 (*)    All other components within normal limits  COMPREHENSIVE METABOLIC PANEL - Abnormal; Notable for the following:    Glucose, Bld 115 (*)    BUN 26 (*)    All other components within normal limits  TROPONIN I  LIPASE, BLOOD    Imaging Review Dg Chest 2 View  05/13/2015  CLINICAL DATA:  50 year old female with chest pain EXAM: CHEST  2 VIEW COMPARISON:  Radiograph dated 02/06/2015 FINDINGS: Two views of the chest do not demonstrate a focal consolidation. There is no pleural effusion or pneumothorax. Linear and platelike atelectatic changes noted in the lower lung fields. The cardiac silhouette is within normal limits. The osseous structures appear unremarkable. IMPRESSION: No active cardiopulmonary disease. Electronically Signed   By: Anner Crete M.D.   On: 05/13/2015 19:40   I have personally reviewed and evaluated these images and lab results as part of my medical decision-making.   EKG Interpretation None       ED  ECG REPORT I, Khushbu Pippen, the attending physician, personally viewed and interpreted this ECG.   Date: 05/13/2015  EKG Time: 19:12 PM  Rate: 77  Rhythm: normal EKG, normal sinus rhythm  Axis: normal  Intervals:none  ST&T Change: none   MDM   Final diagnoses:  None    Kimberly Rivas is a 50 y.o. female here with constant chest pain since yesterday. Afebrile, not tachycardic. She states that she had hypokalemia before and recently started on HCTZ so hypokalemia possible. Consider ACS as well. I doubt dissection or PE. Also consider gastritis from taking vicodin. Will get trop x 1 (symptoms for more than 24 hrs), labs,  CXR. Will give GI cocktail.   8:42 PM K nl. Trop neg. Given GI cocktail, symptoms slightly improved. I think likely gastritis from taking motrin, norco. Will add pepcid prn, nexium daily. Of note, patient recently saw cardiology for chest pain that is going on since October last year. Had holter and stress test in the last several months that were unremarkable and was thought to have reflux and on zantac. I doubt ACS. Will dc home.     Wandra Arthurs, MD 05/13/15 2043  Wandra Arthurs, MD 05/13/15 2045

## 2015-05-13 NOTE — ED Notes (Signed)
Patient transported to X-ray 

## 2015-05-15 ENCOUNTER — Ambulatory Visit: Payer: BLUE CROSS/BLUE SHIELD

## 2015-05-22 ENCOUNTER — Telehealth: Payer: Self-pay | Admitting: *Deleted

## 2015-05-22 NOTE — Telephone Encounter (Signed)
Pt informed she is negative for sleep apnea per [place]. Nothing further needed.

## 2015-06-28 ENCOUNTER — Ambulatory Visit: Payer: BLUE CROSS/BLUE SHIELD | Admitting: Cardiovascular Disease

## 2015-06-29 ENCOUNTER — Ambulatory Visit: Payer: BLUE CROSS/BLUE SHIELD | Admitting: Cardiovascular Disease

## 2015-07-16 ENCOUNTER — Other Ambulatory Visit: Payer: Self-pay

## 2015-07-16 ENCOUNTER — Telehealth: Payer: Self-pay | Admitting: *Deleted

## 2015-07-16 DIAGNOSIS — R0789 Other chest pain: Secondary | ICD-10-CM

## 2015-07-16 DIAGNOSIS — R911 Solitary pulmonary nodule: Secondary | ICD-10-CM

## 2015-07-16 NOTE — Telephone Encounter (Signed)
Order placed for CT Chest w/o with correct dx code.

## 2015-07-18 ENCOUNTER — Ambulatory Visit
Admission: RE | Admit: 2015-07-18 | Discharge: 2015-07-18 | Disposition: A | Discharge status: Home / Self Care | Payer: BLUE CROSS/BLUE SHIELD | Source: Ambulatory Visit | Attending: Internal Medicine | Admitting: Internal Medicine

## 2015-07-18 DIAGNOSIS — Z1231 Encounter for screening mammogram for malignant neoplasm of breast: Secondary | ICD-10-CM

## 2015-07-24 ENCOUNTER — Other Ambulatory Visit: Payer: Self-pay | Admitting: Internal Medicine

## 2015-07-24 DIAGNOSIS — R928 Other abnormal and inconclusive findings on diagnostic imaging of breast: Secondary | ICD-10-CM

## 2015-07-26 ENCOUNTER — Ambulatory Visit
Admission: RE | Admit: 2015-07-26 | Discharge: 2015-07-26 | Disposition: A | Discharge status: Home / Self Care | Payer: BLUE CROSS/BLUE SHIELD | Source: Ambulatory Visit | Attending: Internal Medicine | Admitting: Internal Medicine

## 2015-07-26 DIAGNOSIS — N6489 Other specified disorders of breast: Secondary | ICD-10-CM | POA: Diagnosis not present

## 2015-07-26 DIAGNOSIS — R928 Other abnormal and inconclusive findings on diagnostic imaging of breast: Secondary | ICD-10-CM

## 2015-07-27 ENCOUNTER — Ambulatory Visit (INDEPENDENT_AMBULATORY_CARE_PROVIDER_SITE_OTHER): Payer: BLUE CROSS/BLUE SHIELD | Admitting: Cardiovascular Disease

## 2015-07-27 ENCOUNTER — Encounter: Payer: Self-pay | Admitting: Cardiovascular Disease

## 2015-07-27 VITALS — BP 118/82 | HR 83 | Ht 63.0 in | Wt 199.0 lb

## 2015-07-27 DIAGNOSIS — R0602 Shortness of breath: Secondary | ICD-10-CM | POA: Diagnosis not present

## 2015-07-27 DIAGNOSIS — R0789 Other chest pain: Secondary | ICD-10-CM

## 2015-07-27 NOTE — Patient Instructions (Signed)
Medication Instructions: Continue same medications.   Labwork: None.   Procedures/Testing: None.   Follow-Up: 6 months with Dr. Luvia Orzechowski.   Any Additional Special Instructions Will Be Listed Below (If Applicable).     If you need a refill on your cardiac medications before your next appointment, please call your pharmacy.   

## 2015-07-27 NOTE — Progress Notes (Signed)
Cardiology Office Note   Date:  07/27/2015   ID:  Kimberly Rivas, DOB [birth year], MRN [id number]  PCP:  Casilda Carls, MD  Cardiologist:   Kathlyn Sacramento, MD   Chief Complaint  Patient presents with  . other    3 month follow up. Meds reviewed by the patient verbally. Pt. c/o chest tightness and shortness of breath.       History of Present Illness: Kimberly Rivas is a 50 y.o. female who presents for a follow up visit -year-old female who Is here today for a follow-up visit regarding chest pain and palpitations. She has prolonged history of exertional palpitations and has been on metoprolol for few years. She has no history of hypertension, diabetes or tobacco use. She does have hyperlipidemia. There is no family history of premature coronary artery disease.   She underwent a treadmill nuclear stress test in 02/2015 by Dr. Rosario Jacks. She was able to exercise for almost 9 minutes with no chest pain, EKG changes or perfusion defects.  Echocardiogram in January was normal. Holter monitor showed rare PACs and PVCs. Overall, she is feeling better. She was hypertensive during her last visit and small dose hydrochlorothiazide was added.   Past Medical History  Diagnosis Date  . Hypothyroid     a. dx'd at age 39  . Hypertension   . HLD (hyperlipidemia)   . Palpitations     a. 48 hr Holter 02/2015: NSR, rare PACs and PVCs. Otherwise, no significant arrhythmia   . Atypical chest pain   . Obese   . Dysrhythmia   . Anxiety   . GERD (gastroesophageal reflux disease)   . Anemia     H/O  . Shortness of breath dyspnea     RELATED TO ANXIETY PER PT-PT STATES SHE CAN WALK A MILE WITHOUT GETTING SOB (05-09-15)PT IS SEEING DR Mortimer Fries FOR A PULMONARY NODULE SEEN ON CT    Past Surgical History  Procedure Laterality Date  . Breast reduction surgery    . Cesarean section      X3  . Colonoscopy    . Knee arthroscopy Left 05/10/2015    Procedure: ARTHROSCOPY KNEE, partial medial meniscectomy;   Surgeon: Hessie Knows, MD;  Location: [facility] ORS;  Service: Orthopedics;  Laterality: Left;  . Reduction mammaplasty Bilateral 1998     Current Outpatient Prescriptions  Medication Sig Dispense Refill  . aspirin EC 81 MG tablet Take 81 mg by mouth daily.    Marland Kitchen esomeprazole (NEXIUM) 20 MG capsule Take 1 capsule (20 mg total) by mouth daily. 30 capsule 0  . famotidine (PEPCID) 20 MG tablet Take 1 tablet (20 mg total) by mouth 2 (two) times daily as needed for heartburn or indigestion. 30 tablet 0  . hydrochlorothiazide (MICROZIDE) 12.5 MG capsule Take 1 capsule (12.5 mg total) by mouth daily. 90 capsule 3  . HYDROcodone-acetaminophen (NORCO) 5-325 MG tablet Take 1 tablet by mouth every 6 (six) hours as needed for moderate pain. 30 tablet 0  . ibuprofen (ADVIL,MOTRIN) 200 MG tablet Take 600 mg by mouth every 6 (six) hours as needed for mild pain or moderate pain.     Marland Kitchen levothyroxine (SYNTHROID, LEVOTHROID) 112 MCG tablet Take 112 mcg by mouth daily before breakfast.    . metoprolol tartrate (LOPRESSOR) 25 MG tablet Take 25 mg by mouth 2 (two) times daily.    . ranitidine (ZANTAC) 150 MG tablet Take 1 tablet (150 mg total) by mouth at bedtime. (Patient taking differently: Take 150  mg by mouth as needed. ) 30 tablet 1   No current facility-administered medications for this visit.    Allergies:   Tramadol    Social History:  The patient  reports that she has never smoked. She has never used smokeless tobacco. She reports that she does not drink alcohol or use illicit drugs.   Family History:  The patient's family history includes Acute myelogenous leukemia in her mother; COPD in her mother; Cancer in her paternal aunt; Epilepsy in her brother; Heart Problems in her mother, paternal grandfather, and paternal grandmother; Hypertension in her mother; Hypothyroidism in her mother; Stroke in her father and mother.    ROS:  Please see the history of present illness.   Otherwise, review of systems are  positive for none.   All other systems are reviewed and negative.    PHYSICAL EXAM: VS:  BP 118/82 mmHg  Pulse 83  Ht 5\' 3"  (1.6 m)  Wt 199 lb (90.266 kg)  BMI 35.26 kg/m2 , BMI Body mass index is 35.26 kg/(m^2). GEN: Well nourished, well developed, in no acute distress HEENT: normal Neck: no JVD, carotid bruits, or masses Cardiac: RRR; no murmurs, rubs, or gallops,no edema  Respiratory:  clear to auscultation bilaterally, normal work of breathing GI: soft, nontender, nondistended, + BS MS: no deformity or atrophy Skin: warm and dry, no rash Neuro:  Strength and sensation are intact Psych: euthymic mood, full affect   EKG:  EKG is ordered today. The ekg ordered today demonstrates normal sinus rhythm with no significant ST or T wave changes.   Recent Labs: 04/13/2015: TSH 2.340 05/13/2015: ALT 39; BUN 26*; Creatinine, Ser 0.82; Hemoglobin 13.8; Platelets 323; Potassium 4.0; Sodium 139    Lipid Panel No results found for: CHOL, TRIG, HDL, CHOLHDL, VLDL, LDLCALC, LDLDIRECT    Wt Readings from Last 3 Encounters:  07/27/15 199 lb (90.266 kg)  05/13/15 192 lb (87.091 kg)  05/10/15 192 lb (87.091 kg)        ASSESSMENT AND PLAN:  1.  Atypical chest pain: Negative cardiac workup including stress test and echocardiogram. Symptoms improved without intervention.  2. Palpitations: Holter monitor showed no significant arrhythmia other than. Premature beats. Continue treatment with metoprolol.  3. Essential hypertension: Blood pressure is well controlled on current medications.    Disposition:   FU with me in 6 months  Signed,  Kathlyn Sacramento, MD  07/27/2015 3:58 PM    [place]

## 2015-08-09 ENCOUNTER — Ambulatory Visit
Admission: RE | Admit: 2015-08-09 | Discharge: 2015-08-09 | Disposition: A | Discharge status: Home / Self Care | Payer: BLUE CROSS/BLUE SHIELD | Source: Ambulatory Visit | Attending: Internal Medicine | Admitting: Internal Medicine

## 2015-08-09 DIAGNOSIS — R911 Solitary pulmonary nodule: Secondary | ICD-10-CM | POA: Diagnosis present

## 2015-08-14 ENCOUNTER — Ambulatory Visit: Payer: BLUE CROSS/BLUE SHIELD | Admitting: *Deleted

## 2015-08-14 ENCOUNTER — Ambulatory Visit (INDEPENDENT_AMBULATORY_CARE_PROVIDER_SITE_OTHER): Payer: BLUE CROSS/BLUE SHIELD | Admitting: *Deleted

## 2015-08-14 DIAGNOSIS — R0602 Shortness of breath: Secondary | ICD-10-CM | POA: Diagnosis not present

## 2015-08-14 DIAGNOSIS — R0600 Dyspnea, unspecified: Secondary | ICD-10-CM

## 2015-08-14 LAB — PULMONARY FUNCTION TEST
DL/VA % pred: 98 %
DL/VA: 4.6 ml/min/mmHg/L
DLCO unc % pred: 85 %
DLCO unc: 19.67 ml/min/mmHg
FEF 25-75 Post: 2.75 L/sec
FEF 25-75 Pre: 2.58 L/sec
FEF2575-%CHANGE-POST: 6 %
FEF2575-%PRED-PRE: 97 %
FEF2575-%Pred-Post: 104 %
FEV1-%CHANGE-POST: 1 %
FEV1-%PRED-POST: 86 %
FEV1-%PRED-PRE: 84 %
FEV1-POST: 2.31 L
FEV1-PRE: 2.27 L
FEV1FVC-%CHANGE-POST: 4 %
FEV1FVC-%Pred-Pre: 104 %
FEV6-%CHANGE-POST: -2 %
FEV6-%PRED-PRE: 82 %
FEV6-%Pred-Post: 80 %
FEV6-PRE: 2.7 L
FEV6-Post: 2.64 L
FEV6FVC-%PRED-PRE: 103 %
FEV6FVC-%Pred-Post: 103 %
FVC-%CHANGE-POST: -2 %
FVC-%PRED-PRE: 79 %
FVC-%Pred-Post: 78 %
FVC-POST: 2.65 L
FVC-Pre: 2.7 L
POST FEV1/FVC RATIO: 87 %
POST FEV6/FVC RATIO: 100 %
PRE FEV6/FVC RATIO: 100 %
Pre FEV1/FVC ratio: 84 %
RV % PRED: 103 %
RV: 1.83 L
TLC % PRED: 95 %
TLC: 4.66 L

## 2015-08-14 NOTE — Progress Notes (Signed)
PFT performed today. 

## 2015-08-14 NOTE — Progress Notes (Signed)
SMW performed today. 

## 2015-08-16 ENCOUNTER — Encounter: Payer: Self-pay | Admitting: Internal Medicine

## 2015-08-16 ENCOUNTER — Ambulatory Visit (INDEPENDENT_AMBULATORY_CARE_PROVIDER_SITE_OTHER): Payer: BLUE CROSS/BLUE SHIELD | Admitting: Internal Medicine

## 2015-08-16 VITALS — BP 140/88 | HR 74 | Ht 63.0 in | Wt 198.0 lb

## 2015-08-16 DIAGNOSIS — R911 Solitary pulmonary nodule: Secondary | ICD-10-CM

## 2015-08-16 MED ORDER — ALBUTEROL SULFATE HFA 108 (90 BASE) MCG/ACT IN AERS: 2.0000 | INHALATION_SPRAY | Freq: Four times a day (QID) | RESPIRATORY_TRACT | Status: DC | PRN

## 2015-08-16 NOTE — Patient Instructions (Signed)

## 2015-08-16 NOTE — Progress Notes (Signed)
Kimberly Rivas      Date: 08/16/2015,   MRN# [id number] Kimberly Rivas [birth year] Code Status:  Hosp day:@LENGTHOFSTAYDAYS @ Referring MD: @ATDPROV @     PCP:      Admission                  Current  Kimberly Rivas is a 50 y.o. old female seen in [facility] for SOB, ?pulm nodule.     CHIEF COMPLAINT:   Follow SOB   HISTORY OF PRESENT ILLNESS   Patient here for follow up test results, no new complaints Sleep study WNL ONO wnl PFT ratio 84% WNL, Fev1 84% FVC 79% 6MWT wnl Ct chest results reviewed with patient 08/16/2015 stable RUL lung nodule approx 3 MM  Patient has intermittent wheezing caused by irritants(perfumes, colognes, lysol)  No signs of infection at this time     Current Medication:  Current outpatient prescriptions:  .  aspirin EC 81 MG tablet, Take 81 mg by mouth daily., Disp: , Rfl:  .  esomeprazole (NEXIUM) 20 MG capsule, Take 1 capsule (20 mg total) by mouth daily., Disp: 30 capsule, Rfl: 0 .  famotidine (PEPCID) 20 MG tablet, Take 1 tablet (20 mg total) by mouth 2 (two) times daily as needed for heartburn or indigestion., Disp: 30 tablet, Rfl: 0 .  hydrochlorothiazide (MICROZIDE) 12.5 MG capsule, Take 1 capsule (12.5 mg total) by mouth daily., Disp: 90 capsule, Rfl: 3 .  HYDROcodone-acetaminophen (NORCO) 5-325 MG tablet, Take 1 tablet by mouth every 6 (six) hours as needed for moderate pain., Disp: 30 tablet, Rfl: 0 .  ibuprofen (ADVIL,MOTRIN) 200 MG tablet, Take 600 mg by mouth every 6 (six) hours as needed for mild pain or moderate pain. , Disp: , Rfl:  .  levothyroxine (SYNTHROID, LEVOTHROID) 112 MCG tablet, Take 112 mcg by mouth daily before breakfast., Disp: , Rfl:  .  metoprolol tartrate (LOPRESSOR) 25 MG tablet, Take 25 mg by mouth 2 (two) times daily., Disp: , Rfl:  .  ranitidine (ZANTAC) 150 MG tablet, Take 1 tablet (150 mg total) by mouth at bedtime. (Patient taking differently: Take 150 mg by mouth as needed.  ), Disp: 30 tablet, Rfl: 1    ALLERGIES   Tramadol     REVIEW OF SYSTEMS   Review of Systems  Constitutional: Negative for fever, chills, weight loss and malaise/fatigue.  HENT: Negative for congestion.   Eyes: Negative for blurred vision.  Respiratory: Negative for cough, hemoptysis, sputum production, shortness of breath and wheezing.   Cardiovascular: Negative for chest pain, palpitations, orthopnea and leg swelling.  Skin: Negative for rash.  All other systems reviewed and are negative.    VS: BP 140/88 mmHg  Pulse 74  SpO2 95%     PHYSICAL EXAM  Physical Exam  Constitutional: She is oriented to person, place, and time. No distress.  HENT:  Mouth/Throat: No oropharyngeal exudate.  Eyes: Pupils are equal, round, and reactive to light.  Cardiovascular: Normal rate, regular rhythm and normal heart sounds.   No murmur heard. Pulmonary/Chest: No stridor. No respiratory distress. She has no wheezes.  Musculoskeletal: Normal range of motion. She exhibits no edema.  Neurological: She is alert and oriented to person, place, and time.  Skin: Skin is warm and dry. She is not diaphoretic.  Psychiatric: She has a normal mood and affect.       IMAGING    Ct Chest Wo Contrast  08/09/2015  CLINICAL DATA:  Followup indeterminate right lung  nodule. Chest pain and palpitations. EXAM: CT CHEST WITHOUT CONTRAST TECHNIQUE: Multidetector CT imaging of the chest was performed following the standard protocol without IV contrast. COMPARISON:  Chest CT on 04/27/2013 and PET-CT on 01/09/2011 FINDINGS: Mediastinum/Lymph Nodes: No masses or pathologically enlarged lymph nodes identified on this un-enhanced exam. Normal heart size. Minimal left anterior descending coronary artery calcification noted. Lungs/Pleura: 3 mm subpleural pulmonary nodule in the posterior right upper lobe on image 28 of series 3 remains stable since 2012, consistent with benign postinflammatory etiology. No new new or  enlarging pulmonary nodules or masses identified. Scarring is again seen involving the right middle lobe and lingula. No evidence of pulmonary infiltrate or pleural effusion. Upper abdomen: No acute findings. Musculoskeletal: No chest wall mass or suspicious bone lesions identified. IMPRESSION: Stable 3 mm right upper lobe pulmonary nodule, consistent with benign postinflammatory etiology. No active lung disease or other acute findings within the thorax. Electronically Signed   By: Earle Gell M.D.   On: 08/09/2015 18:01   Mm Digital Screening Bilateral  07/20/2015  CLINICAL DATA:  Screening. EXAM: DIGITAL SCREENING BILATERAL MAMMOGRAM WITH CAD COMPARISON:  Previous exam(s). ACR Breast Density Category b: There are scattered areas of fibroglandular density. FINDINGS: In the right breast, possible distortion warrants further evaluation. In the left breast, no findings suspicious for malignancy. Images were processed with CAD. IMPRESSION: Further evaluation is suggested for possible distortion in the right breast. RECOMMENDATION: Diagnostic mammogram and possibly ultrasound of the right breast. (Code:FI-R-64M) The patient will be contacted regarding the findings, and additional imaging will be scheduled. BI-RADS CATEGORY  0: Incomplete. Need additional imaging evaluation and/or prior mammograms for comparison. Electronically Signed   By: Ammie Ferrier M.D.   On: 07/20/2015 10:22   US Breast Ltd Uni Right Inc Axilla  07/26/2015  CLINICAL DATA:  50 year old female, callback from screening mammogram for possible right breast distortion. After being asked to return for further evaluation, the patient noticed a small lump in her inferior right breast. The patient has a history of bilateral reduction mammoplasty in 1988. EXAM: 2D DIGITAL DIAGNOSTIC RIGHT MAMMOGRAM WITH CAD AND ADJUNCT TOMO ULTRASOUND RIGHT BREAST COMPARISON:  Previous exam(s). ACR Breast Density Category c: The breast tissue is heterogeneously  dense, which may obscure small masses. FINDINGS: Cc, MLO, full lateral, and spot-compression views of the right breast with tomosynthesis were performed. On the additional views, expected postsurgical changes of prior reduction mammoplasty are seen. There is no suspicious mass, calcifications, or non surgical distortion in the area of concern seen on screening mammogram. Mammographic images were processed with CAD. On physical exam, no discrete mass is felt in the patient's area of concern in the inferior right breast or within the superior right breast. Targeted ultrasound was performed demonstrating no suspicious cystic or solid sonographic finding in the areas of concern within the superior and inferior right breast. IMPRESSION: No mammographic or sonographic evidence of malignancy. RECOMMENDATION: Screening mammogram in one year.(Code:SM-B-01Y) I have discussed the findings and recommendations with the patient. Results were also provided in writing at the conclusion of the visit. If applicable, a reminder letter will be sent to the patient regarding the next appointment. BI-RADS CATEGORY  2: Benign. Electronically Signed   By: Pamelia Hoit M.D.   On: 07/26/2015 17:18   Mm Diag Breast Tomo Uni Right  07/26/2015  CLINICAL DATA:  50 year old female, callback from screening mammogram for possible right breast distortion. After being asked to return for further evaluation, the patient noticed a small  lump in her inferior right breast. The patient has a history of bilateral reduction mammoplasty in 1988. EXAM: 2D DIGITAL DIAGNOSTIC RIGHT MAMMOGRAM WITH CAD AND ADJUNCT TOMO ULTRASOUND RIGHT BREAST COMPARISON:  Previous exam(s). ACR Breast Density Category c: The breast tissue is heterogeneously dense, which may obscure small masses. FINDINGS: Cc, MLO, full lateral, and spot-compression views of the right breast with tomosynthesis were performed. On the additional views, expected postsurgical changes of prior reduction  mammoplasty are seen. There is no suspicious mass, calcifications, or non surgical distortion in the area of concern seen on screening mammogram. Mammographic images were processed with CAD. On physical exam, no discrete mass is felt in the patient's area of concern in the inferior right breast or within the superior right breast. Targeted ultrasound was performed demonstrating no suspicious cystic or solid sonographic finding in the areas of concern within the superior and inferior right breast. IMPRESSION: No mammographic or sonographic evidence of malignancy. RECOMMENDATION: Screening mammogram in one year.(Code:SM-B-01Y) I have discussed the findings and recommendations with the patient. Results were also provided in writing at the conclusion of the visit. If applicable, a reminder letter will be sent to the patient regarding the next appointment. BI-RADS CATEGORY  2: Benign. Electronically Signed   By: Pamelia Hoit M.D.   On: 07/26/2015 17:18       ASSESSMENT/PLAN   50 yo white female seen today for follow up. All tests are WNL. Patient re-assured Signs and symptoms of reactive airways disease  1.albuterol as needed 2.f/u Ct chest in 6 months at next visit to assess interval changes   Follow up in 6 months    I have personally obtained a history, examined the patient, evaluated laboratory and independently reviewed imaging results, formulated the assessment and plan and placed orders.  The Patient requires high complexity decision making for assessment and support, frequent evaluation and titration of therapies, application of advanced monitoring technologies and extensive interpretation of multiple databases.   Patient satisfied with Plan of action and management. All questions answered  Corrin Parker, M.D.  [facility]  Medical Director [place] Director [facility]

## 2015-08-19 ENCOUNTER — Emergency Department: Payer: BLUE CROSS/BLUE SHIELD

## 2015-08-19 ENCOUNTER — Emergency Department
Admission: EM | Admit: 2015-08-19 | Discharge: 2015-08-20 | Disposition: A | Discharge status: Home / Self Care | Payer: BLUE CROSS/BLUE SHIELD | Attending: Emergency Medicine | Admitting: Emergency Medicine

## 2015-08-19 ENCOUNTER — Encounter: Payer: Self-pay | Admitting: *Deleted

## 2015-08-19 DIAGNOSIS — Z7982 Long term (current) use of aspirin: Secondary | ICD-10-CM | POA: Diagnosis not present

## 2015-08-19 DIAGNOSIS — M7918 Myalgia, other site: Secondary | ICD-10-CM

## 2015-08-19 DIAGNOSIS — Z791 Long term (current) use of non-steroidal anti-inflammatories (NSAID): Secondary | ICD-10-CM | POA: Diagnosis not present

## 2015-08-19 DIAGNOSIS — E785 Hyperlipidemia, unspecified: Secondary | ICD-10-CM | POA: Diagnosis not present

## 2015-08-19 DIAGNOSIS — Z79899 Other long term (current) drug therapy: Secondary | ICD-10-CM | POA: Diagnosis not present

## 2015-08-19 DIAGNOSIS — E039 Hypothyroidism, unspecified: Secondary | ICD-10-CM | POA: Insufficient documentation

## 2015-08-19 DIAGNOSIS — E669 Obesity, unspecified: Secondary | ICD-10-CM | POA: Insufficient documentation

## 2015-08-19 DIAGNOSIS — M79605 Pain in left leg: Secondary | ICD-10-CM | POA: Insufficient documentation

## 2015-08-19 LAB — CBC
HCT: 40 % (ref 35.0–47.0)
HEMOGLOBIN: 13.9 g/dL (ref 12.0–16.0)
MCH: 31.9 pg (ref 26.0–34.0)
MCHC: 34.6 g/dL (ref 32.0–36.0)
MCV: 92.2 fL (ref 80.0–100.0)
PLATELETS: 271 10*3/uL (ref 150–440)
RBC: 4.34 MIL/uL (ref 3.80–5.20)
RDW: 12 % (ref 11.5–14.5)
WBC: 9.3 10*3/uL (ref 3.6–11.0)

## 2015-08-19 LAB — COMPREHENSIVE METABOLIC PANEL
ALBUMIN: 4.2 g/dL (ref 3.5–5.0)
ALT: 30 U/L (ref 14–54)
ANION GAP: 5 (ref 5–15)
AST: 24 U/L (ref 15–41)
Alkaline Phosphatase: 92 U/L (ref 38–126)
BUN: 18 mg/dL (ref 6–20)
CALCIUM: 9.3 mg/dL (ref 8.9–10.3)
CHLORIDE: 105 mmol/L (ref 101–111)
CO2: 29 mmol/L (ref 22–32)
Creatinine, Ser: 0.8 mg/dL (ref 0.44–1.00)
GFR calc non Af Amer: >60 mL/min (ref >60)
GLUCOSE: 96 mg/dL (ref 65–99)
POTASSIUM: 4 mmol/L (ref 3.5–5.1)
SODIUM: 139 mmol/L (ref 135–145)
Total Bilirubin: 0.6 mg/dL (ref 0.3–1.2)
Total Protein: 7.6 g/dL (ref 6.5–8.1)

## 2015-08-19 LAB — TROPONIN I: Troponin I: <0.03 ng/mL (ref <0.031)

## 2015-08-19 NOTE — ED Provider Notes (Signed)
[facility] Emergency Department Provider Note  Time seen: 11:14 PM  I have reviewed the triage vital signs and the nursing notes.   HISTORY  Chief Complaint Claudication and Shortness of Breath    HPI Kimberly Rivas is a 50 y.o. female with a past medical history of hypertension, hyperlipidemia, asthma, anxiety, presents the emergency department with chest tightness and left lower extremity pain. According to the patient for the past one month she has been feeling tightness in the chest. Per record review the patient has been worked up by cardiology including a stress test, as well as pulmonology including a CT scan of the chest, it is thought that her tightness is due to reactive airway disease/asthma. Patient states her chest tightness is actually somewhat improved over what it has been for the past one month. Denies any trouble breathing. Denies any chest "pain." Patient states she is here today because since yesterday her left lower extremity has been hurting. Yesterday she states it is mostly with movement but today it has been constant. Describes the pain as a dull aching sensation in mild to moderate in severity located mostly in the posterior thigh and behind the knee. Occasionally she states she will have a sudden severe pain in the same area. Denies any nausea, diaphoresis. Denies any history of DVT. The patient takes aspirin daily.     Past Medical History  Diagnosis Date  . Hypothyroid     a. dx'd at age 38  . Hypertension   . HLD (hyperlipidemia)   . Palpitations     a. 48 hr Holter 02/2015: NSR, rare PACs and PVCs. Otherwise, no significant arrhythmia   . Atypical chest pain   . Obese   . Dysrhythmia   . Anxiety   . GERD (gastroesophageal reflux disease)   . Anemia     H/O  . Shortness of breath dyspnea     RELATED TO ANXIETY PER PT-PT STATES SHE CAN WALK A MILE WITHOUT GETTING SOB (05-09-15)PT IS SEEING DR Mortimer Fries FOR A PULMONARY NODULE SEEN ON  CT    Patient Active Problem List   Diagnosis Date Noted  . Hypothyroid   . Atypical chest pain   . Obese   . Chest pressure 02/16/2015  . Palpitations 02/16/2015    Past Surgical History  Procedure Laterality Date  . Breast reduction surgery    . Cesarean section      X3  . Colonoscopy    . Knee arthroscopy Left 05/10/2015    Procedure: ARTHROSCOPY KNEE, partial medial meniscectomy;  Surgeon: Hessie Knows, MD;  Location: [facility] ORS;  Service: Orthopedics;  Laterality: Left;  . Reduction mammaplasty Bilateral 1998    Current Outpatient Rx  Name  Route  Sig  Dispense  Refill  . albuterol (PROVENTIL HFA;VENTOLIN HFA) 108 (90 Base) MCG/ACT inhaler   Inhalation   Inhale 2 puffs into the lungs every 6 (six) hours as needed for wheezing or shortness of breath.   1 Inhaler   2   . aspirin EC 81 MG tablet   Oral   Take 81 mg by mouth daily.         Marland Kitchen esomeprazole (NEXIUM) 20 MG capsule   Oral   Take 1 capsule (20 mg total) by mouth daily.   30 capsule   0   . famotidine (PEPCID) 20 MG tablet   Oral   Take 1 tablet (20 mg total) by mouth 2 (two) times daily as needed for heartburn  or indigestion.   30 tablet   0   . HYDROcodone-acetaminophen (NORCO) 5-325 MG tablet   Oral   Take 1 tablet by mouth every 6 (six) hours as needed for moderate pain.   30 tablet   0   . ibuprofen (ADVIL,MOTRIN) 200 MG tablet   Oral   Take 600 mg by mouth every 6 (six) hours as needed for mild pain or moderate pain.          Marland Kitchen levothyroxine (SYNTHROID, LEVOTHROID) 112 MCG tablet   Oral   Take 112 mcg by mouth daily before breakfast.         . metoprolol tartrate (LOPRESSOR) 25 MG tablet   Oral   Take 25 mg by mouth 2 (two) times daily.         . ranitidine (ZANTAC) 150 MG tablet   Oral   Take 1 tablet (150 mg total) by mouth at bedtime. Patient taking differently: Take 150 mg by mouth as needed.    30 tablet   1     Allergies Tramadol  Family History  Problem Relation  Age of Onset  . Acute myelogenous leukemia Mother   . COPD Mother   . Heart Problems Mother   . Hypertension Mother   . Hypothyroidism Mother   . Stroke Mother   . Stroke Father   . Epilepsy Brother   . Cancer Paternal Aunt   . Heart Problems Paternal Grandmother   . Heart Problems Paternal Grandfather     Social History Social History  Substance Use Topics  . Smoking status: Never Smoker   . Smokeless tobacco: Never Used  . Alcohol Use: No    Review of Systems Constitutional: Negative for fever. Cardiovascular: Positive for chest tightness Respiratory: Negative for shortness of breath. Gastrointestinal: Negative for abdominal pain Musculoskeletal: Negative for back pain Neurological: Negative for headache 10-point ROS otherwise negative.  ____________________________________________   PHYSICAL EXAM:  VITAL SIGNS: ED Triage Vitals  Enc Vitals Group     BP 08/19/15 2109 154/91 mmHg     Pulse Rate 08/19/15 2109 76     Resp 08/19/15 2109 20     Temp 08/19/15 2109 98.3 F (36.8 C)     Temp Source 08/19/15 2109 Oral     SpO2 08/19/15 2109 97 %     Weight 08/19/15 2109 198 lb (89.812 kg)     Height 08/19/15 2109 5\' 3"  (1.6 m)     Head Cir --      Peak Flow --      Pain Score 08/19/15 2114 0     Pain Loc --      Pain Edu? --      Excl. in [place]? --     Constitutional: Alert and oriented. Well appearing and in no distress. Eyes: Normal exam ENT   Head: Normocephalic and atraumatic.   Mouth/Throat: Mucous membranes are moist. Cardiovascular: Normal rate, regular rhythm. No murmur Respiratory: Normal respiratory effort without tachypnea nor retractions. Breath sounds are clear  Gastrointestinal: Soft and nontender. No distention.   Musculoskeletal: Patient with significant tenderness to palpation over the left medial thigh, posterior thigh, and popliteal fossa. Patient has a small bruise approximately 2 x 2 centimeters to the left medial thigh, denies any  known trauma. 2+ DP pulse. Sensation intact and equal bilateral lower extremities. Neurologic:  Normal speech and language. No gross focal neurologic deficits  Skin:  Skin is warm, dry and intact.  Psychiatric: Mood and affect are normal.  ____________________________________________    EKG  EKG reviewed and interpreted by myself shows normal sinus rhythm at 63 bpm, narrow QRS, normal axis, normal intervals, no ST changes. Normal EKG.    RADIOLOGY  Ultrasound negative for DVT.  ____________________________________________    INITIAL IMPRESSION / ASSESSMENT AND PLAN / ED COURSE  Pertinent labs & imaging results that were available during my care of the patient were reviewed by me and considered in my medical decision making (see chart for details).  The patient presents the emergency department left lower extremity pain and chest tightness. She states the chest tightness is unchanged for the past one month, denies any worsening. Denies any active shortness of breath. Patient has been worked up by cardiology including a stress test, and by pulmonology including a CT scan of the chest. Patient states her main concern today is left lower extremity pain which has worsened over the past 2 days. We will obtain an ultrasound to rule out DVT. Patient denies any trauma to the leg. She does have a small bruise to the medial thigh. Patient does have considerable tenderness to palpation of the thigh, but states minimal pain as long as she is not moving or touching it.  Ultrasound negative for DVT. Labs are within normal limits including negative troponin. We will discharge home with pain medication and PCP follow-up. Patient agreeable to plan.  ____________________________________________   FINAL CLINICAL IMPRESSION(S) / ED DIAGNOSES  Leg pain, left   Harvest Dark, MD 08/20/15 [id number]

## 2015-08-19 NOTE — ED Notes (Signed)
Pt taken to ultrasound

## 2015-08-19 NOTE — ED Notes (Signed)
Pt c/o shortness of breath x 1 month. Pt states she is seeing a lung specialist. Pt states CT of chest performed x 2 wks ago. Pt states sudden onset of L leg pain yesterday. Pt states surgery on L knee this past February. Pt states posterior R thigh is exquisitely tender to palpation, worse pain today. Pt is not in acute respiratory distress at this time.

## 2015-08-20 MED ORDER — HYDROCODONE-ACETAMINOPHEN 5-325 MG PO TABS: 1.0000 | ORAL_TABLET | ORAL | Status: DC | PRN

## 2015-08-20 NOTE — Discharge Instructions (Signed)

## 2015-08-20 NOTE — ED Notes (Signed)
Discharge instructions reviewed with patient. Patient verbalized understanding. Patient ambulated to lobby without difficulty.   

## 2016-02-27 ENCOUNTER — Encounter: Payer: Self-pay | Admitting: Internal Medicine

## 2016-05-13 ENCOUNTER — Ambulatory Visit (INDEPENDENT_AMBULATORY_CARE_PROVIDER_SITE_OTHER): Payer: BLUE CROSS/BLUE SHIELD | Admitting: Cardiovascular Disease

## 2016-05-13 ENCOUNTER — Encounter: Payer: Self-pay | Admitting: Cardiovascular Disease

## 2016-05-13 VITALS — BP 138/98 | HR 80 | Ht 63.0 in | Wt 202.5 lb

## 2016-05-13 DIAGNOSIS — R002 Palpitations: Secondary | ICD-10-CM

## 2016-05-13 DIAGNOSIS — I10 Essential (primary) hypertension: Secondary | ICD-10-CM

## 2016-05-13 NOTE — Patient Instructions (Signed)
Medication Instructions: Continue same medications.   Labwork: None.   Procedures/Testing: None.   Follow-Up: As needed with Dr. Arida.   Any Additional Special Instructions Will Be Listed Below (If Applicable).     If you need a refill on your cardiac medications before your next appointment, please call your pharmacy.   

## 2016-05-13 NOTE — Progress Notes (Signed)
Cardiology Office Note   Date:  05/13/2016   ID:  Kimberly Rivas, DOB 05/04/1965, MRN [id number]  PCP:  Kimberly Rivas  Cardiologist:   Kimberly Sacramento, MD   Chief Complaint  Patient presents with  . other    6 month follow up. Pt. c/o feeling extreamly tired and shortness of breath. Meds reviewed by the pt. verbally.       History of Present Illness: Kimberly Rivas is a 51 y.o. female who presents for a follow up visit regarding chest pain and palpitations. She has prolonged history of exertional palpitations and has been on a beta blocker for few years. She has no history of hypertension, diabetes or tobacco use. She does have hyperlipidemia. There is no family history of premature coronary artery disease.   She underwent a treadmill nuclear stress test in 02/2015 by Dr. Rosario Rivas. She was able to exercise for almost 9 minutes with no chest pain, EKG changes or perfusion defects.  Echocardiogram in January was normal. Holter monitor showed rare PACs and PVCs.  She has been doing reasonably well and denies any chest pain. She has mild palpitations overall. She stopped taking hydrochlorothiazide due to fatigue.   Past Medical History:  Diagnosis Date  . Anemia    H/O  . Anxiety   . Atypical chest pain   . Dysrhythmia   . GERD (gastroesophageal reflux disease)   . HLD (hyperlipidemia)   . Hypertension   . Hypothyroid    a. dx'd at age 66  . Obese   . Palpitations    a. 48 hr Holter 02/2015: NSR, rare PACs and PVCs. Otherwise, no significant arrhythmia   . Shortness of breath dyspnea    RELATED TO ANXIETY PER PT-PT STATES SHE CAN WALK A MILE WITHOUT GETTING SOB (05-09-15)PT IS SEEING DR Mortimer Fries FOR A PULMONARY NODULE SEEN ON CT    Past Surgical History:  Procedure Laterality Date  . BREAST REDUCTION SURGERY    . CESAREAN SECTION     X3  . COLONOSCOPY    . KNEE ARTHROSCOPY Left 05/10/2015   Procedure: ARTHROSCOPY KNEE, partial medial meniscectomy;  Surgeon: Kimberly Knows, MD;   Location: [facility] ORS;  Service: Orthopedics;  Laterality: Left;  . REDUCTION MAMMAPLASTY Bilateral 1998     Current Outpatient Prescriptions  Medication Sig Dispense Refill  . albuterol (PROVENTIL HFA;VENTOLIN HFA) 108 (90 Base) MCG/ACT inhaler Inhale 2 puffs into the lungs every 6 (six) hours as needed for wheezing or shortness of breath. 1 Inhaler 2  . aspirin EC 81 MG tablet Take 81 mg by mouth daily.    Marland Kitchen esomeprazole (NEXIUM) 20 MG capsule Take 1 capsule (20 mg total) by mouth daily. 30 capsule 0  . ibuprofen (ADVIL,MOTRIN) 200 MG tablet Take 600 mg by mouth every 6 (six) hours as needed for mild pain or moderate pain.     Marland Kitchen levothyroxine (SYNTHROID, LEVOTHROID) 137 MCG tablet Take 137 mcg by mouth daily before breakfast.    . propranolol (INDERAL) 10 MG tablet Take 10 mg by mouth 3 (three) times daily.     No current facility-administered medications for this visit.     Allergies:   Tramadol    Social History:  The patient  reports that she has never smoked. She has never used smokeless tobacco. She reports that she does not drink alcohol or use drugs.   Family History:  The patient's family history includes Acute myelogenous leukemia in her mother; COPD in her mother; Cancer  in her paternal aunt; Epilepsy in her brother; Heart Problems in her mother, paternal grandfather, and paternal grandmother; Hypertension in her mother; Hypothyroidism in her mother; Stroke in her father and mother.    ROS:  Please see the history of present illness.   Otherwise, review of systems are positive for none.   All other systems are reviewed and negative.    PHYSICAL EXAM: VS:  BP (!) 138/98 (BP Location: Right Arm, Patient Position: Sitting, Cuff Size: Normal)   Pulse 80   Ht 5\' 3"  (1.6 m)   Wt 202 lb 8 oz (91.9 kg)   BMI 35.87 kg/m  , BMI Body mass index is 35.87 kg/m. GEN: Well nourished, well developed, in no acute distress  HEENT: normal  Neck: no JVD, carotid bruits, or  masses Cardiac: RRR; no murmurs, rubs, or gallops,no edema  Respiratory:  clear to auscultation bilaterally, normal work of breathing GI: soft, nontender, nondistended, + BS MS: no deformity or atrophy  Skin: warm and dry, no rash Neuro:  Strength and sensation are intact Psych: euthymic mood, full affect   EKG:  EKG is ordered today. The ekg ordered today demonstrates normal sinus rhythm with no significant ST or T wave changes.   Recent Labs: 08/19/2015: ALT 30; BUN 18; Creatinine, Ser 0.80; Hemoglobin 13.9; Platelets 271; Potassium 4.0; Sodium 139    Lipid Panel No results found for: CHOL, TRIG, HDL, CHOLHDL, VLDL, LDLCALC, LDLDIRECT    Wt Readings from Last 3 Encounters:  05/13/16 202 lb 8 oz (91.9 kg)  08/19/15 198 lb (89.8 kg)  08/16/15 198 lb (89.8 kg)        ASSESSMENT AND PLAN:   1. Palpitations: Overall mild PACs and PVCs. EKG is normal today and no premature beats by physical exam. She can follow-up with me as needed.  3. Essential hypertension: Blood pressure is mildly elevated. She stopped taking hydrochlorothiazide due to fatigue. Can consider treatment with an ACE inhibitor/ARB or amlodipine if needed.  Disposition:   FU with me as needed.   Signed,  Kimberly Sacramento, MD  05/13/2016 3:33 PM    [facility]

## 2016-05-23 ENCOUNTER — Other Ambulatory Visit: Payer: Self-pay

## 2016-05-23 ENCOUNTER — Telehealth: Payer: Self-pay | Admitting: Cardiovascular Disease

## 2016-05-23 NOTE — Telephone Encounter (Signed)
Per Dr. Rockey Situ, may increase propranolol to 20mg  TID w/an extra 20mg  PRN. If this is ineffective, could consider bystolic. Reviewed instructions w/pt who is agreeable w/plan.  She will call back Monday with weekend HR readings. Medication list updated.

## 2016-05-23 NOTE — Telephone Encounter (Signed)
Pt states her resting heart rate this morning has been 110 -118.  States she took her Propranolol at 12 noon, and it has not came down. States she has had some dizziness and lightheadedness, and she feels "flushed". Please call. States she does have some SOB.

## 2016-05-23 NOTE — Telephone Encounter (Signed)
S/w pt who reports resting heart rate over 100bpm for the past few days.  She has a hx of palpitations; takes propranolol TID with little improvement.  Today BP 140/90, resting HR 118-120. She has taken morning and lunchtime propranolol but HR continues to be elevated. She feels flushed and has had two dizzy spells this week. They lasted "a few minutes" and resolved on their own.   Will forward to Dr. Fletcher Anon for recommendations regarding increasing propranolol.

## 2016-05-26 NOTE — Telephone Encounter (Signed)
This might be a thyroid related issue.

## 2016-05-26 NOTE — Telephone Encounter (Signed)
Pt calling stating her HR did go down but she thinks it may be her Thyroids may be acting up again But she states the medicine has helped  Would like advise  Please call

## 2016-05-26 NOTE — Telephone Encounter (Signed)
S/w pt who confirmed she took propranolol 20mg  TID this weekend as instructed. HR improved to 80-90s but increases to 100bpm just before next dose is due..  She did not take PRN dosage. She remains concerned of the HR 100. PCP increased levothyroxine  from 164mcg to 127mcg six months ago and she has not had blood work since. She is agreeable to contact PCP to check thyroid levels. Routed to MD for further recommendations.

## 2016-05-26 NOTE — Telephone Encounter (Signed)
Pt saw Dr. Rosario Jacks this afternoon and is having labs tomorrow morning. Provided fax number for results. Pt verbalized understanding.

## 2016-07-22 ENCOUNTER — Other Ambulatory Visit: Payer: Self-pay | Admitting: Internal Medicine

## 2016-07-22 DIAGNOSIS — Z1231 Encounter for screening mammogram for malignant neoplasm of breast: Secondary | ICD-10-CM

## 2016-08-12 ENCOUNTER — Ambulatory Visit
Admission: RE | Admit: 2016-08-12 | Discharge: 2016-08-12 | Disposition: A | Discharge status: Home / Self Care | Payer: BLUE CROSS/BLUE SHIELD | Source: Ambulatory Visit | Attending: Internal Medicine | Admitting: Internal Medicine

## 2016-08-12 DIAGNOSIS — Z1231 Encounter for screening mammogram for malignant neoplasm of breast: Secondary | ICD-10-CM | POA: Insufficient documentation

## 2016-12-15 ENCOUNTER — Telehealth: Payer: Self-pay | Admitting: Cardiovascular Disease

## 2016-12-15 ENCOUNTER — Ambulatory Visit (INDEPENDENT_AMBULATORY_CARE_PROVIDER_SITE_OTHER): Payer: BLUE CROSS/BLUE SHIELD | Admitting: Cardiovascular Disease

## 2016-12-15 ENCOUNTER — Encounter: Payer: Self-pay | Admitting: Cardiovascular Disease

## 2016-12-15 VITALS — BP 133/88 | HR 82 | Ht 63.0 in | Wt 203.5 lb

## 2016-12-15 DIAGNOSIS — I10 Essential (primary) hypertension: Secondary | ICD-10-CM

## 2016-12-15 DIAGNOSIS — R079 Chest pain, unspecified: Secondary | ICD-10-CM | POA: Diagnosis not present

## 2016-12-15 DIAGNOSIS — R002 Palpitations: Secondary | ICD-10-CM

## 2016-12-15 NOTE — Telephone Encounter (Signed)
Pt in office for appt w/Dr. Fletcher Anon.

## 2016-12-15 NOTE — Telephone Encounter (Signed)
Patient calling for asap appt   She has been having cp and sob feels tightening in the throat and doesn't believe it is GI related.    Patient scheduled for Today "same Day" appt slot to see Dr. Fletcher Anon .

## 2016-12-15 NOTE — Patient Instructions (Signed)
Medication Instructions:  Your physician recommends that you continue on your current medications as directed. Please refer to the Current Medication list given to you today.   Labwork: none  Testing/Procedures: CT angio of the coronary arteries [place]  You will receive a call with an appointment date.    Follow-Up: Your physician recommends that you schedule a follow-up appointment as needed.    Any Other Special Instructions Will Be Listed Below (If Applicable).     If you need a refill on your cardiac medications before your next appointment, please call your pharmacy.

## 2016-12-15 NOTE — Progress Notes (Signed)
Cardiology Office Note   Date:  12/15/2016   ID:  Kimberly Rivas, DOB [birth year], MRN [id number]  PCP:  Casilda Carls, MD  Cardiologist:   Kathlyn Sacramento, MD   Chief Complaint  Patient presents with  . other    Chest pain, sob , palpitaitons and feels like she has a lump in her throat. Meds reviewed verbally with pt.      History of Present Illness: Kimberly Rivas is a 51 y.o. female who presents for a follow up visit regarding chest pain and palpitations. She was admitted to my schedule today due to worsening symptoms. She has prolonged history of exertional palpitations and has been on a beta blocker for few years. She has no history of hypertension, diabetes or tobacco use. She does have hyperlipidemia. There is no family history of premature coronary artery disease.   She underwent a treadmill nuclear stress test in 02/2015 and 11/2015 by Dr. Rosario Jacks. She was able to exercise for almost 9 minutes with no chest pain, EKG changes or perfusion defects.  Echocardiogram in January of 2017 was normal. Holter monitor showed rare PACs and PVCs.   She reports worsening symptoms of substernal chest tightness radiating to her throat. She feels that there is a lump in her throat. The symptoms worsen with physical activities. She does not feel more stressed than her usual. She had pulmonary evaluation last year which was unremarkable. She feels very tired with shortness of breath with minimal activities.  Past Medical History:  Diagnosis Date  . Anemia    H/O  . Anxiety   . Atypical chest pain   . Dysrhythmia   . GERD (gastroesophageal reflux disease)   . HLD (hyperlipidemia)   . Hypertension   . Hypothyroid    a. dx'd at age 41  . Obese   . Palpitations    a. 48 hr Holter 02/2015: NSR, rare PACs and PVCs. Otherwise, no significant arrhythmia   . Shortness of breath dyspnea    RELATED TO ANXIETY PER PT-PT STATES SHE CAN WALK A MILE WITHOUT GETTING SOB (05-09-15)PT IS SEEING DR  Mortimer Fries FOR A PULMONARY NODULE SEEN ON CT    Past Surgical History:  Procedure Laterality Date  . BREAST REDUCTION SURGERY    . CESAREAN SECTION     X3  . COLONOSCOPY    . KNEE ARTHROSCOPY Left 05/10/2015   Procedure: ARTHROSCOPY KNEE, partial medial meniscectomy;  Surgeon: Hessie Knows, MD;  Location: [facility] ORS;  Service: Orthopedics;  Laterality: Left;  . REDUCTION MAMMAPLASTY Bilateral 1998     Current Outpatient Prescriptions  Medication Sig Dispense Refill  . aspirin EC 81 MG tablet Take 81 mg by mouth daily.    Marland Kitchen esomeprazole (NEXIUM) 20 MG capsule Take 1 capsule (20 mg total) by mouth daily. 30 capsule 0  . ibuprofen (ADVIL,MOTRIN) 200 MG tablet Take 600 mg by mouth every 6 (six) hours as needed for mild pain or moderate pain.     Marland Kitchen levothyroxine (SYNTHROID, LEVOTHROID) 137 MCG tablet Take 137 mcg by mouth daily before breakfast.    . propranolol (INDERAL) 10 MG tablet Take 20 mg by mouth 3 (three) times daily.     No current facility-administered medications for this visit.     Allergies:   Tramadol    Social History:  The patient  reports that she has never smoked. She has never used smokeless tobacco. She reports that she does not drink alcohol or use drugs.   Family  History:  The patient's family history includes Acute myelogenous leukemia in her mother; COPD in her mother; Cancer in her paternal aunt; Epilepsy in her brother; Heart Problems in her mother, paternal grandfather, and paternal grandmother; Hypertension in her mother; Hypothyroidism in her mother; Stroke in her father and mother.    ROS:  Please see the history of present illness.   Otherwise, review of systems are positive for none.   All other systems are reviewed and negative.    PHYSICAL EXAM: VS:  BP 133/88 (BP Location: Left Arm, Patient Position: Sitting, Cuff Size: Large)   Pulse 82   Ht 5\' 3"  (1.6 m)   Wt 203 lb 8 oz (92.3 kg)   BMI 36.05 kg/m  , BMI Body mass index is 36.05 kg/m. GEN: Well  nourished, well developed, in no acute distress  HEENT: normal  Neck: no JVD, carotid bruits, or masses Cardiac: RRR; no murmurs, rubs, or gallops,no edema  Respiratory:  clear to auscultation bilaterally, normal work of breathing GI: soft, nontender, nondistended, + BS MS: no deformity or atrophy  Skin: warm and dry, no rash Neuro:  Strength and sensation are intact Psych: euthymic mood, full affect   EKG:  EKG is ordered today. The ekg ordered today demonstrates normal sinus rhythm with no significant ST or T wave changes.   Recent Labs: No results found for requested labs within last 8760 hours.    Lipid Panel No results found for: CHOL, TRIG, HDL, CHOLHDL, VLDL, LDLCALC, LDLDIRECT    Wt Readings from Last 3 Encounters:  12/15/16 203 lb 8 oz (92.3 kg)  05/13/16 202 lb 8 oz (91.9 kg)  08/19/15 198 lb (89.8 kg)        ASSESSMENT AND PLAN:   1. Chest pain with some anginal and some atypical features: This is associated with significant dyspnea. The patient had previous workup in the past including a stress test and echocardiogram and both were unremarkable. She also had pulmonary evaluation done last year which was nonrevealing. I do think we have to have definitive test to rule out the possibility of underlying obstructive coronary artery disease. Due to that, I have recommended proceeding with CTA of the coronary arteries for evaluation. The patient is not allergic to contrast and she has normal renal function. Continue treatment with propranolol.  2. Palpitations:  Controlled with propranolol.  3. Essential hypertension:  Blood pressure is controlled.  Disposition:   FU with me based on the results of CTA.  Signed,  Kathlyn Sacramento, MD  12/15/2016 3:41 PM    [facility]

## 2016-12-22 ENCOUNTER — Encounter: Payer: Self-pay | Admitting: Cardiovascular Disease

## 2016-12-26 ENCOUNTER — Ambulatory Visit (HOSPITAL_COMMUNITY)
Admission: RE | Admit: 2016-12-26 | Discharge: 2016-12-26 | Disposition: A | Discharge status: Home / Self Care | Payer: BLUE CROSS/BLUE SHIELD | Source: Ambulatory Visit | Attending: Cardiovascular Disease | Admitting: Cardiovascular Disease

## 2016-12-26 ENCOUNTER — Encounter (HOSPITAL_COMMUNITY): Payer: Self-pay

## 2016-12-26 DIAGNOSIS — R079 Chest pain, unspecified: Secondary | ICD-10-CM | POA: Insufficient documentation

## 2016-12-26 MED ORDER — NITROGLYCERIN 0.4 MG SL SUBL
0.8000 mg | SUBLINGUAL_TABLET | Freq: Once | SUBLINGUAL | Status: AC
  Administered 2016-12-26: 0.8 mg via SUBLINGUAL

## 2016-12-26 MED ORDER — IOPAMIDOL (ISOVUE-370) INJECTION 76%
INTRAVENOUS | Status: AC
  Administered 2016-12-26: 80 mL
  Filled 2016-12-26: qty 100

## 2016-12-26 MED ORDER — METOPROLOL TARTRATE 5 MG/5ML IV SOLN
INTRAVENOUS | Status: AC
  Administered 2016-12-26: 5 mg via INTRAVENOUS
  Filled 2016-12-26: qty 10

## 2016-12-26 MED ORDER — NITROGLYCERIN 0.4 MG SL SUBL
SUBLINGUAL_TABLET | SUBLINGUAL | Status: AC
  Administered 2016-12-26: 0.8 mg via SUBLINGUAL
  Filled 2016-12-26: qty 2

## 2016-12-26 MED ORDER — METOPROLOL TARTRATE 5 MG/5ML IV SOLN
5.0000 mg | INTRAVENOUS | Status: DC | PRN
  Administered 2016-12-26 (×2): 5 mg via INTRAVENOUS

## 2016-12-28 IMAGING — US US EXTREM LOW VENOUS*L*
1 series · 13 of 24 positions shown · non-contrast
Comparison: None.

CLINICAL DATA: 49-year-old female with left thigh pain.



[Series 1: us extrem low venous*left* · 0.08mm/px · 13 of 34 slices shown]
[im 1/34]
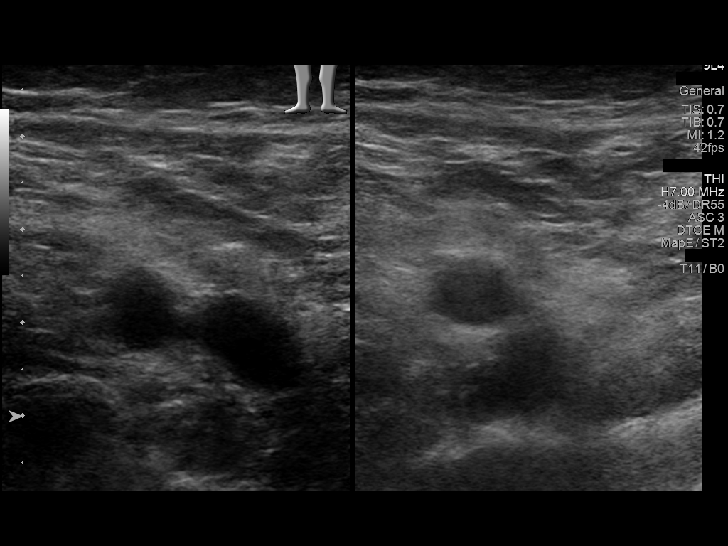
[im 3/34]
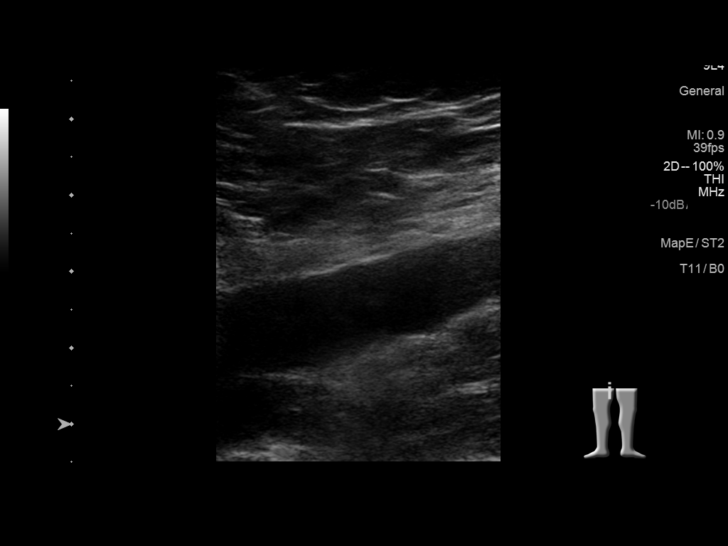
[im 6/34]
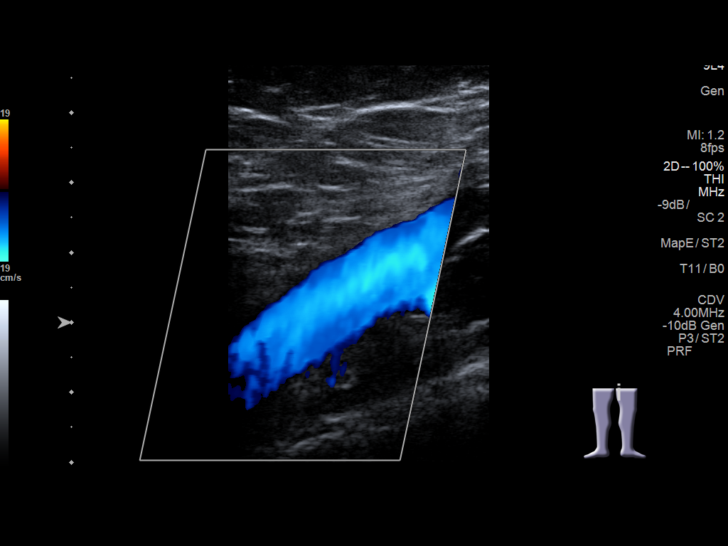
[im 9/34]
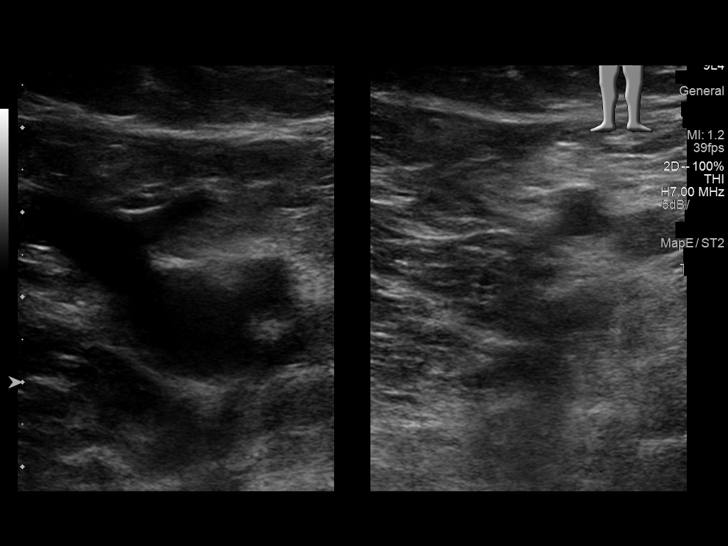
[im 12/34]
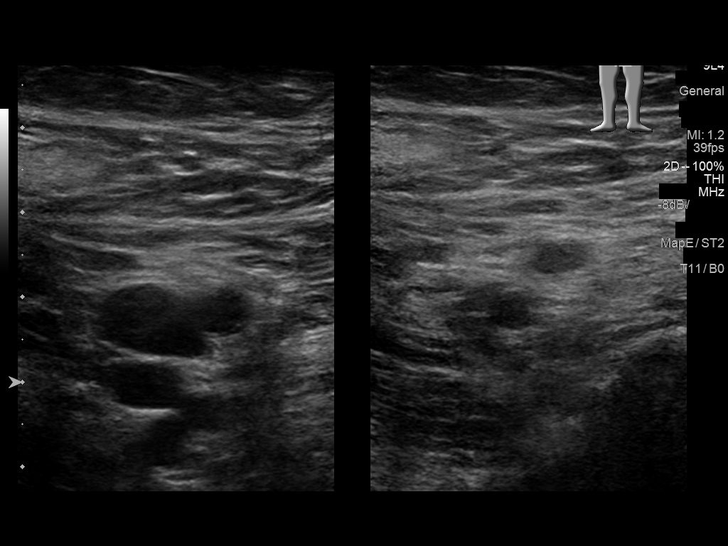
[im 15/34]
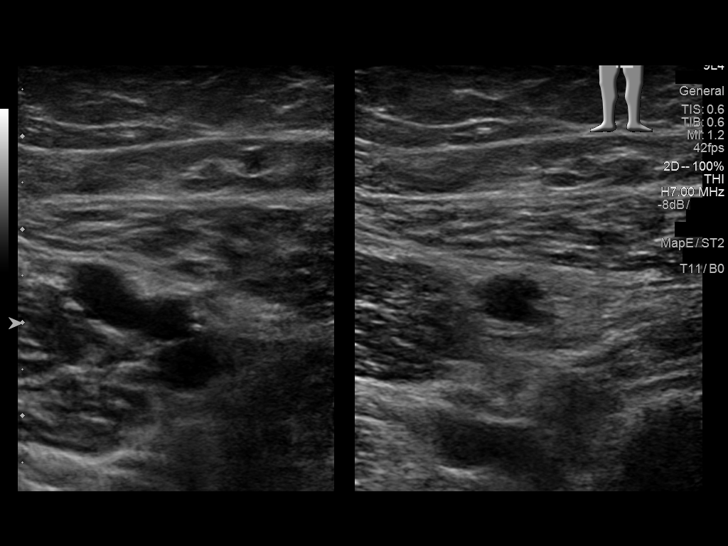
[im 18/34]
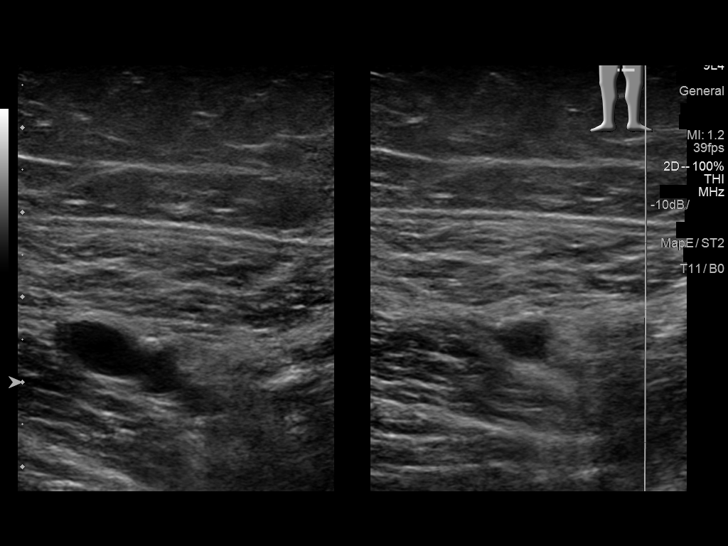
[im 19/34]
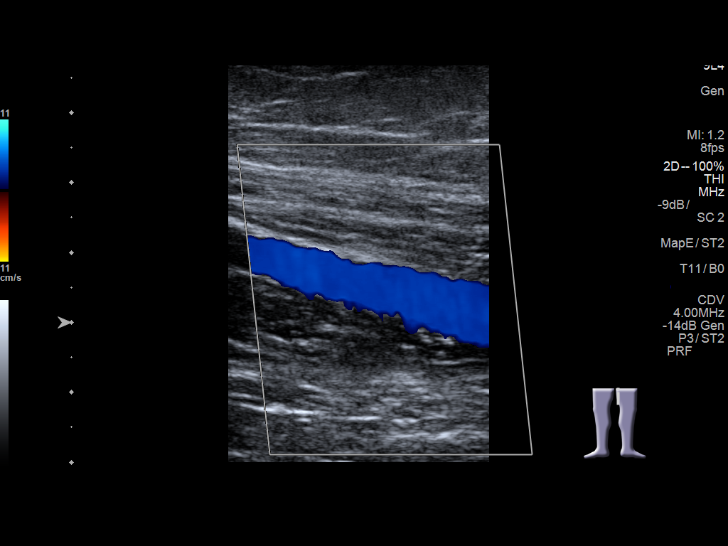
[im 22/34]
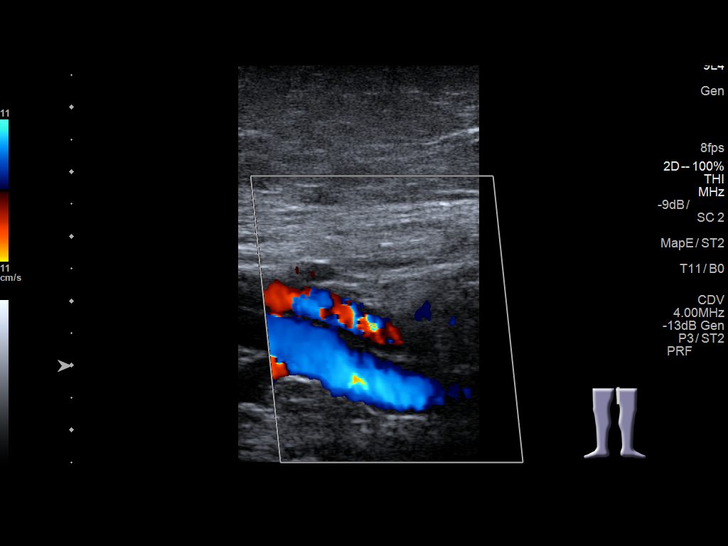
[im 25/34]
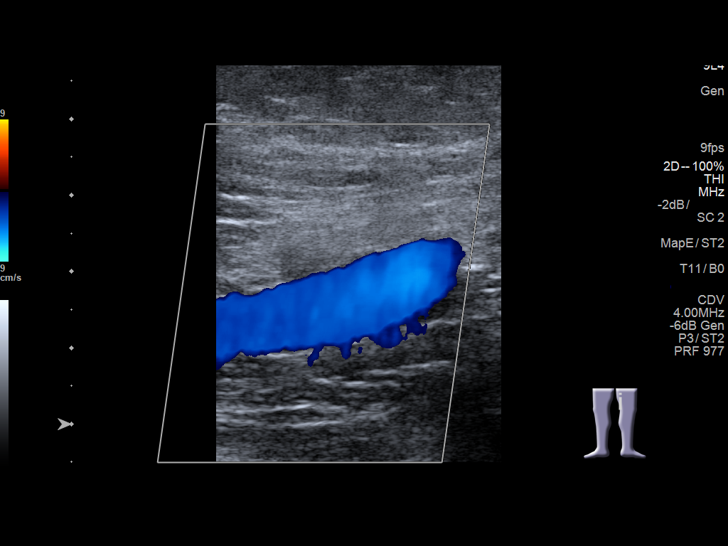
[im 28/34]
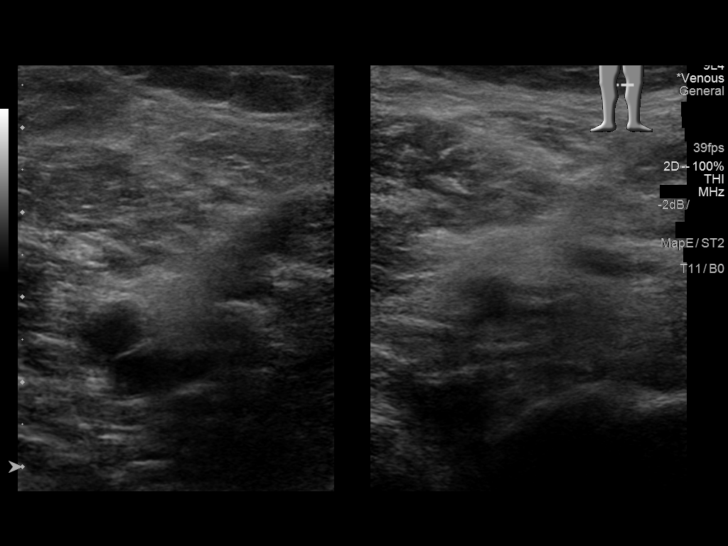
[im 31/34]
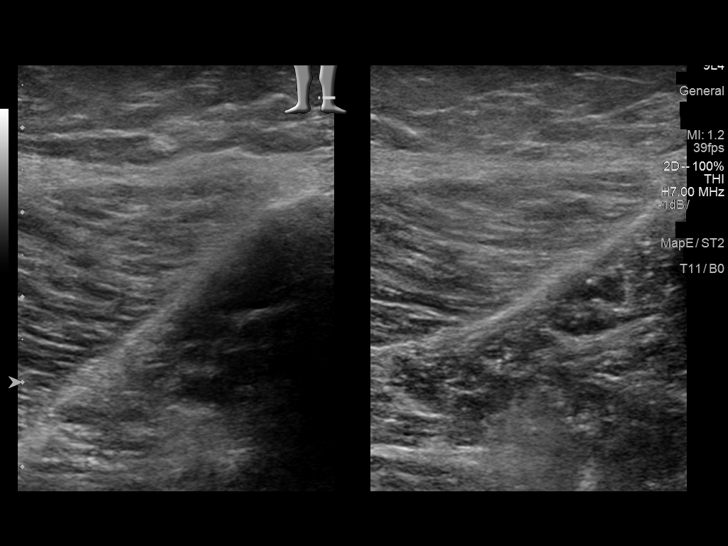
[im 34/34]
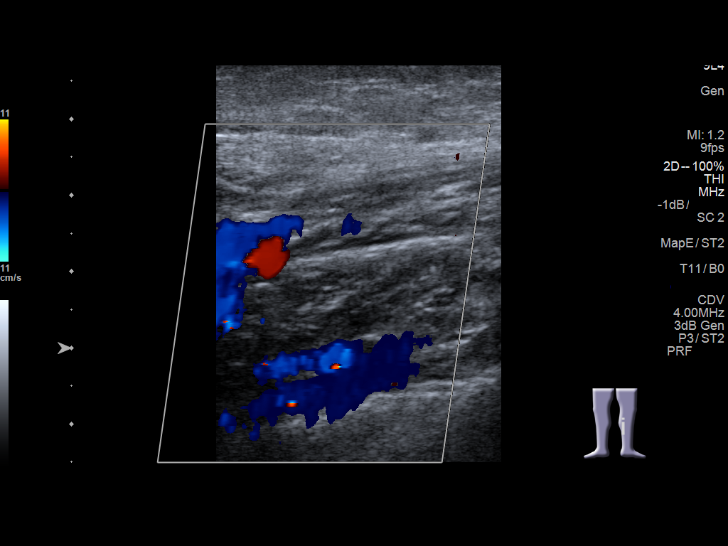

[13 of 24 positions shown; findings below may reference images not displayed]

FINDINGS: Contralateral Common Femoral Vein: Respiratory phasicity is normal
and symmetric with the symptomatic side. No evidence of thrombus.
Normal compressibility.

Common Femoral Vein: No evidence of thrombus. Normal
compressibility, respiratory phasicity and response to augmentation.

Saphenofemoral Junction: No evidence of thrombus. Normal
compressibility and flow on color Doppler imaging.

Profunda Femoral Vein: No evidence of thrombus. Normal
compressibility and flow on color Doppler imaging.

Femoral Vein: No evidence of thrombus. Normal compressibility,
respiratory phasicity and response to augmentation.

Popliteal Vein: No evidence of thrombus. Normal compressibility,
respiratory phasicity and response to augmentation.

Calf Veins: No evidence of thrombus. Normal compressibility and flow
on color Doppler imaging.

Superficial Great Saphenous Vein: No evidence of thrombus. Normal
compressibility and flow on color Doppler imaging.

Venous Reflux:  None.

Other Findings:  None.
IMPRESSION: No evidence of deep venous thrombosis left lower extremity.

## 2016-12-29 ENCOUNTER — Other Ambulatory Visit: Payer: Self-pay

## 2016-12-29 DIAGNOSIS — I2510 Atherosclerotic heart disease of native coronary artery without angina pectoris: Secondary | ICD-10-CM

## 2017-01-09 ENCOUNTER — Other Ambulatory Visit
Admission: RE | Admit: 2017-01-09 | Discharge: 2017-01-09 | Disposition: A | Discharge status: Home / Self Care | Payer: BLUE CROSS/BLUE SHIELD | Source: Ambulatory Visit | Attending: Cardiovascular Disease | Admitting: Cardiovascular Disease

## 2017-01-09 ENCOUNTER — Other Ambulatory Visit: Payer: Self-pay

## 2017-01-09 DIAGNOSIS — I2510 Atherosclerotic heart disease of native coronary artery without angina pectoris: Secondary | ICD-10-CM | POA: Diagnosis present

## 2017-01-09 DIAGNOSIS — E7800 Pure hypercholesterolemia, unspecified: Secondary | ICD-10-CM

## 2017-01-09 LAB — LIPID PANEL
CHOL/HDL RATIO: 6.2 ratio
Cholesterol: 260 mg/dL — ABNORMAL HIGH (ref 0–200)
HDL: 42 mg/dL (ref >40)
LDL CALC: 167 mg/dL — AB (ref 0–99)
Triglycerides: 254 mg/dL — ABNORMAL HIGH (ref <150)
VLDL: 51 mg/dL — ABNORMAL HIGH (ref 0–40)

## 2017-01-09 IMAGING — CT CT CHEST W/O CM
2 of 3 series · 17 of 46 positions shown, 19 images · non-contrast
Comparison: Chest CT on 04/27/2013 and PET-CT on 01/09/2011

CLINICAL DATA: Followup indeterminate right lung nodule. Chest pain
and palpitations.

EXAM:
CT CHEST WITHOUT CONTRAST
TECHNIQUE: Multidetector CT imaging of the chest was performed following the
standard protocol without IV contrast.

[Series 2: routine chest wo · axial · 0.62mm/px · z∈[-572,-336]mm · 14 of 55 slices shown, 16 images]
[im 4/55  soft-tissue]
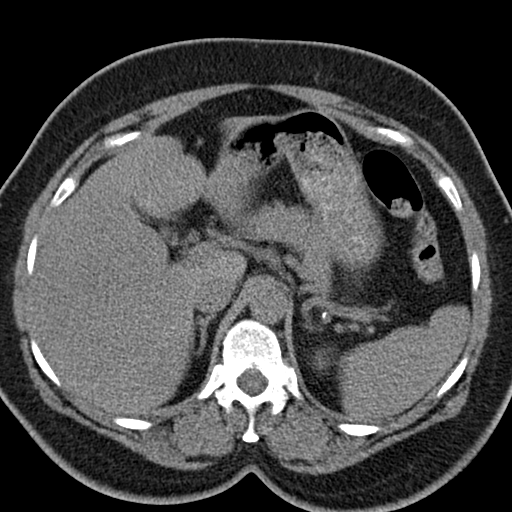
[im 4/55  bone]
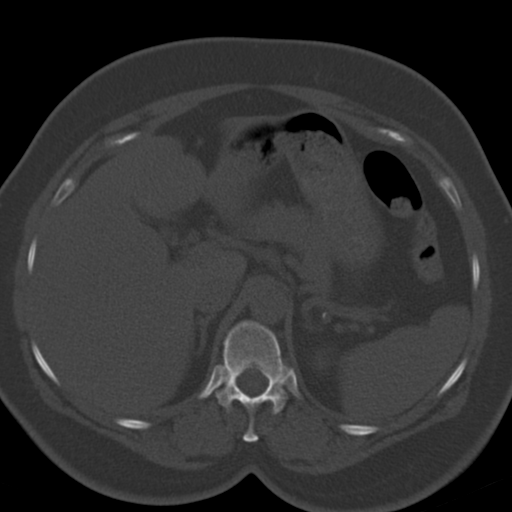
[im 7/55  soft-tissue]
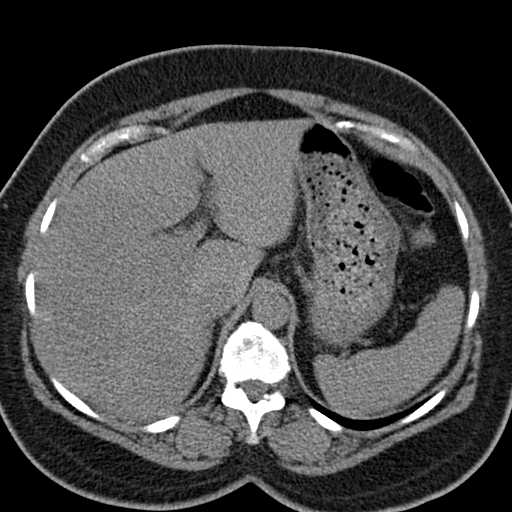
[im 11/55  soft-tissue]
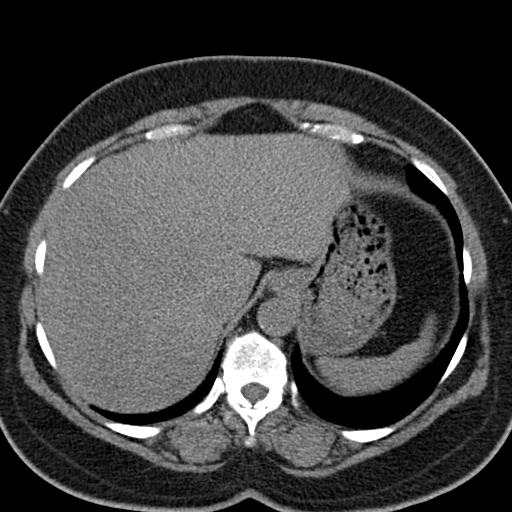
[im 14/55  soft-tissue]
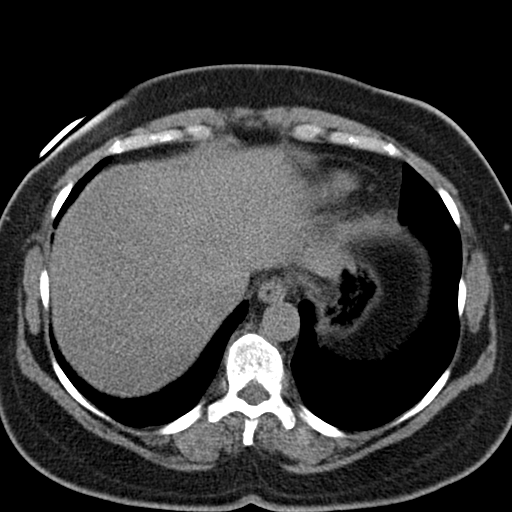
[im 18/55  soft-tissue]
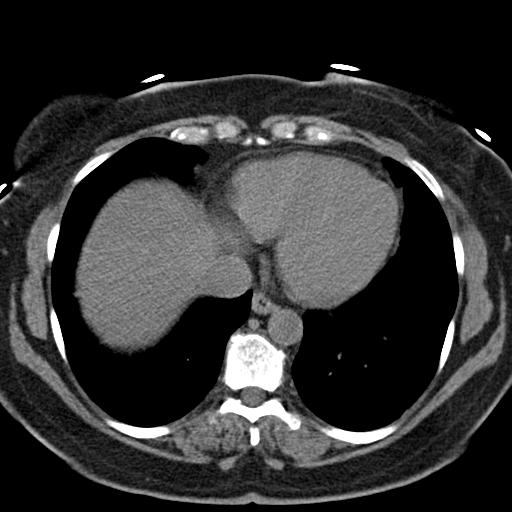
[im 21/55  soft-tissue]
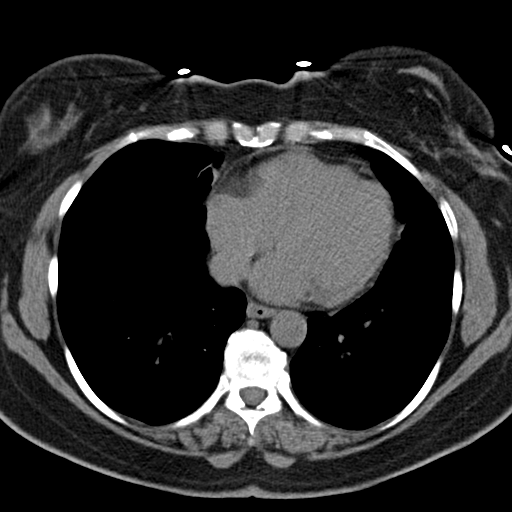
[im 25/55  soft-tissue]
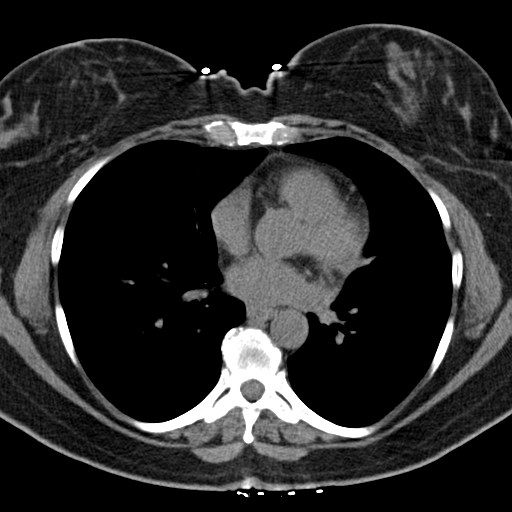
[im 30/55  soft-tissue]
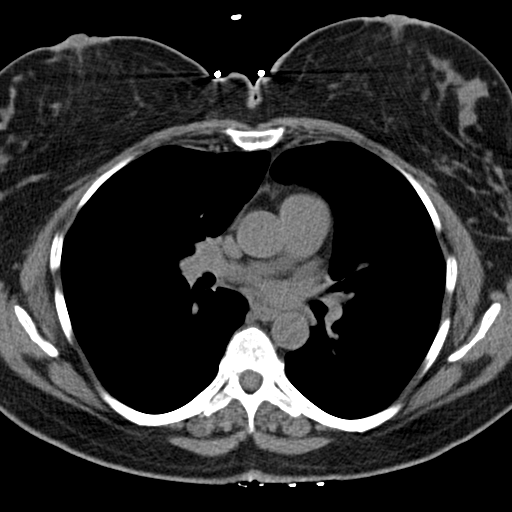
[im 34/55  soft-tissue]
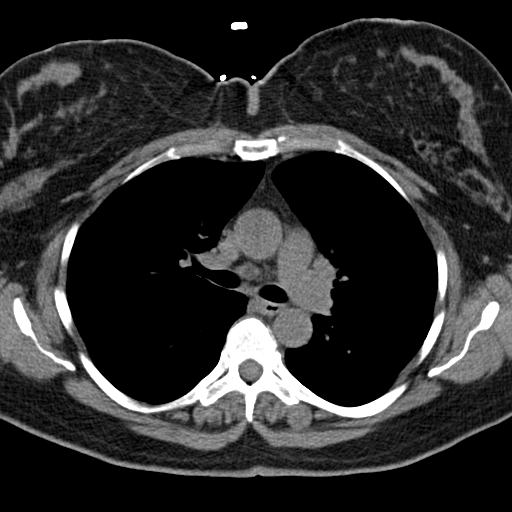
[im 34/55  bone]
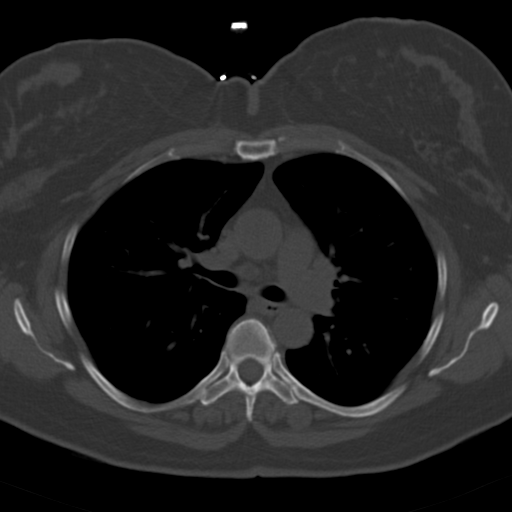
[im 37/55  soft-tissue]
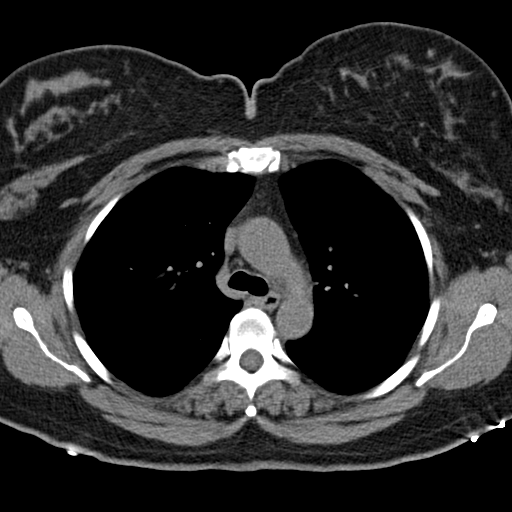
[im 41/55  soft-tissue]
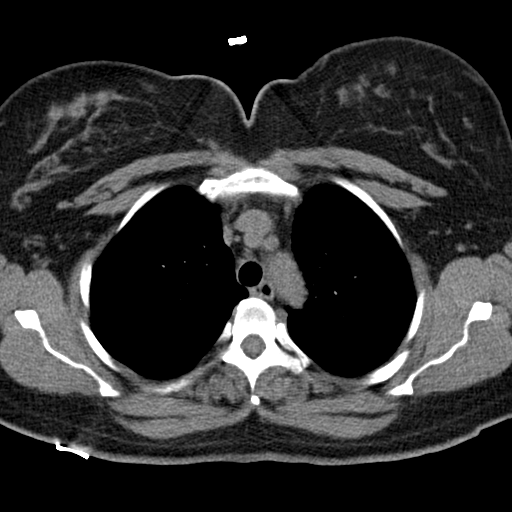
[im 44/55  soft-tissue]
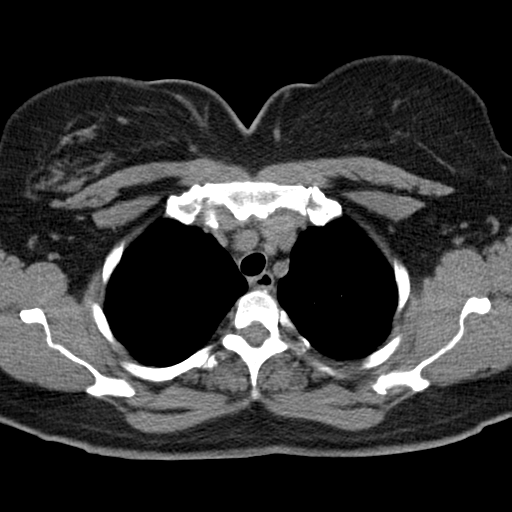
[im 48/55  soft-tissue]
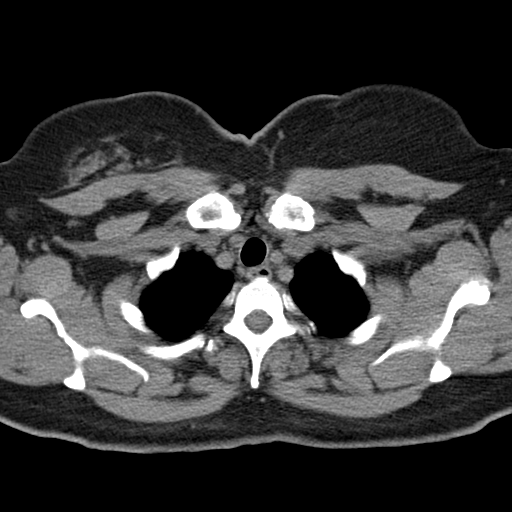
[im 51/55  soft-tissue]
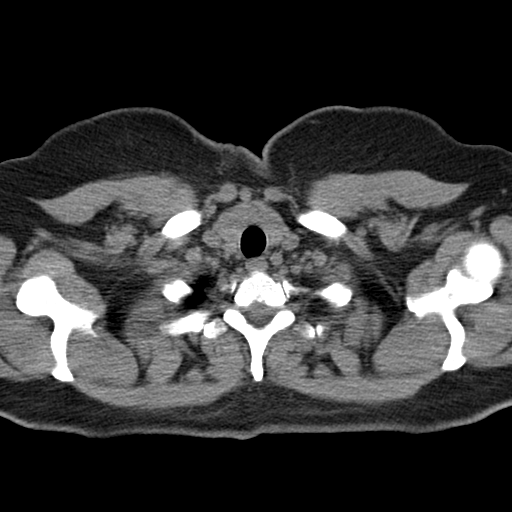

[Series 5: routine chest wo cor · coronal · 0.54mm/px · 3 of 138 slices shown]
[im 46/138  soft-tissue]
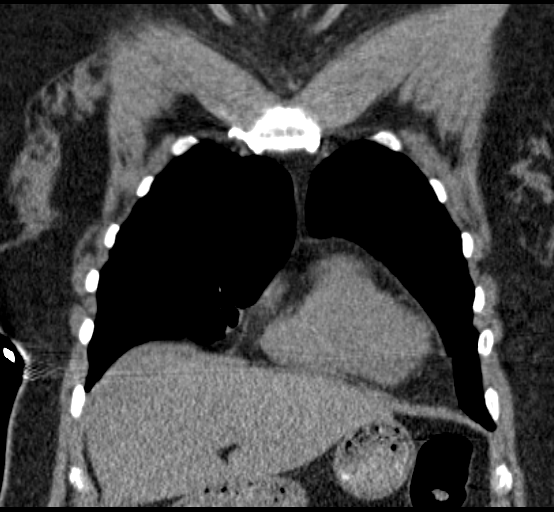
[im 61/138  soft-tissue]
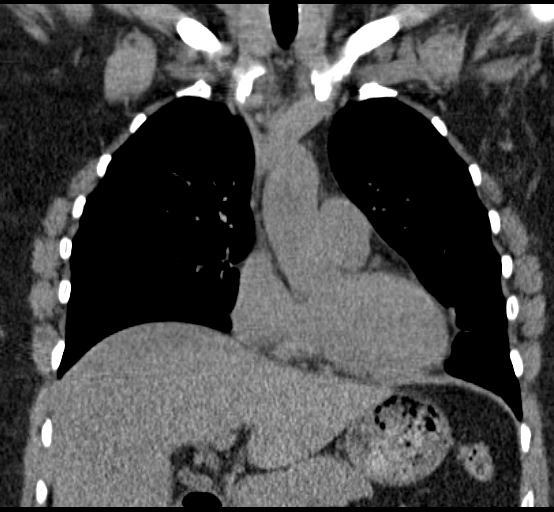
[im 77/138  soft-tissue]
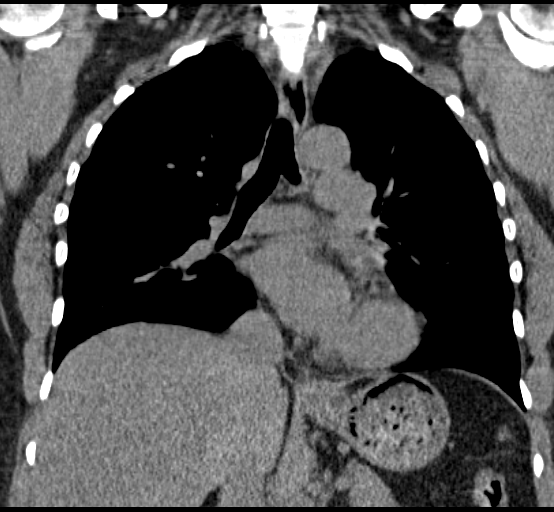

[17 of 46 positions shown; findings below may reference images not displayed]

FINDINGS: Mediastinum/Lymph Nodes: No masses or pathologically enlarged lymph
nodes identified on this un-enhanced exam. Normal heart size.
Minimal left anterior descending coronary artery calcification
noted.

Lungs/Pleura: 3 mm subpleural pulmonary nodule in the posterior
right upper lobe on image 28 of series 3 remains stable since 4874,
consistent with benign postinflammatory etiology. No new new or
enlarging pulmonary nodules or masses identified. Scarring is again
seen involving the right middle lobe and lingula. No evidence of
pulmonary infiltrate or pleural effusion.

Upper abdomen: No acute findings.

Musculoskeletal: No chest wall mass or suspicious bone lesions
identified.
IMPRESSION: Stable 3 mm right upper lobe pulmonary nodule, consistent with
benign postinflammatory etiology. No active lung disease or other
acute findings within the thorax.

## 2017-01-09 MED ORDER — ROSUVASTATIN CALCIUM 20 MG PO TABS: 20.0000 mg | ORAL_TABLET | Freq: Every day | ORAL | 3 refills | Status: DC

## 2017-08-05 ENCOUNTER — Ambulatory Visit: Payer: BLUE CROSS/BLUE SHIELD | Admitting: Internal Medicine

## 2017-08-05 ENCOUNTER — Encounter: Payer: Self-pay | Admitting: Internal Medicine

## 2017-08-05 ENCOUNTER — Ambulatory Visit
Admission: RE | Admit: 2017-08-05 | Discharge: 2017-08-05 | Disposition: A | Discharge status: Home / Self Care | Payer: BLUE CROSS/BLUE SHIELD | Source: Ambulatory Visit | Attending: Internal Medicine | Admitting: Internal Medicine

## 2017-08-05 VITALS — BP 132/82 | HR 68 | Resp 16 | Ht 63.0 in | Wt 204.0 lb

## 2017-08-05 DIAGNOSIS — I2510 Atherosclerotic heart disease of native coronary artery without angina pectoris: Secondary | ICD-10-CM | POA: Diagnosis not present

## 2017-08-05 DIAGNOSIS — R918 Other nonspecific abnormal finding of lung field: Secondary | ICD-10-CM | POA: Diagnosis not present

## 2017-08-05 DIAGNOSIS — R0603 Acute respiratory distress: Secondary | ICD-10-CM | POA: Insufficient documentation

## 2017-08-05 HISTORY — DX: Unspecified asthma, uncomplicated: J45.909

## 2017-08-05 MED ORDER — FLUTICASONE-SALMETEROL 115-21 MCG/ACT IN AERO: 2.0000 | INHALATION_SPRAY | Freq: Two times a day (BID) | RESPIRATORY_TRACT | 12 refills | Status: DC

## 2017-08-05 MED ORDER — IOPAMIDOL (ISOVUE-370) INJECTION 76%
75.0000 mL | Freq: Once | INTRAVENOUS | Status: AC | PRN
  Administered 2017-08-05: 75 mL via INTRAVENOUS

## 2017-08-05 NOTE — Addendum Note (Signed)
Addended by: Stephanie Coup on: 08/05/2017 03:43 PM   Modules accepted: Orders

## 2017-08-05 NOTE — Addendum Note (Signed)
Addended by: Stephanie Coup on: 08/05/2017 03:40 PM   Modules accepted: Orders

## 2017-08-05 NOTE — Patient Instructions (Signed)
To send you for a CAT scan to make sure you do not have blood clots or any pneumonia.  If this is negative then we are likely dealing with an asthma exacerbation and you should start inhaler that was prescribed today as directed.

## 2017-08-05 NOTE — Progress Notes (Addendum)
Kimberly Rivas     Addendum 08/05/17;  I personally reviewed Stat CT angio images and radiology interpretation. There is no evidence of PE or pneumonia. The previously seen nodular area is unchanged. Coronary artery calcifications present.  D/w patient recommend that she try her advair inhaler for 1 week, if not improved should see her PCP for further eval.  -DR  Date: 08/05/2017,   MRN# [id number] Kimberly Rivas April 30, 1965 Code Status:  Hosp day:@LENGTHOFSTAYDAYS @ Referring MD: @ATDPROV @     PCP:      Admission                  Current  Kimberly Rivas  is a 52 y.o. old female seen in [place] for SOB, ?pulm nodule.     CHIEF COMPLAINT:   Follow SOB   HISTORY OF PRESENT ILLNESS   The patient pronounced"Kimberly Rivas" is a 52 year old female who normally sees Dr. Mortimer Fries, she was last seen about 2 years ago.  At that time was noted that she had a normal sleep study, and overnight oximetry.  She was noted to have a 3 mm right upper lobe nodule also asthmatic symptoms with occasional triggers by irritants such as strong scents.  She was recommended to use albuterol as needed and repeat CT chest in about 6 months in follow-up.  She returns today because she had a severe stomach flu a few weeks ago. However she noted recently when she walked the top of the stairs at work which she has been doing for the last several years she got very winded and had to stop, she was very tired and took her a long time to recover.  Since that time her lungs have been hurting, she has been taking the stairs very slowly.  She does not use any inhalers and does not have any at home. Breathing is short even at rest and feels that she is constantly "struggling to breathe" No history of blood clots.  She does note that she has occasional dizziness, especially when she is taking deep breaths.  She has no pets, has reflux controlled by medicine. Denies sinus drainage.  I  personally reviewed Ct chest images on 08/05/2017 stable RUL lung nodule approx 3 MM  OV 08/16/2015 with Dr. Mortimer Fries: Patient here for follow up test results, no new complaints Sleep study WNL ONO wnl PFT ratio 84% WNL, Fev1 84% FVC 79% 6MWT wnl       Current Medication:  Current Outpatient Medications:  .  aspirin EC 81 MG tablet, Take 81 mg by mouth daily., Disp: , Rfl:  .  esomeprazole (NEXIUM) 20 MG capsule, Take 1 capsule (20 mg total) by mouth daily., Disp: 30 capsule, Rfl: 0 .  ibuprofen (ADVIL,MOTRIN) 200 MG tablet, Take 600 mg by mouth every 6 (six) hours as needed for mild pain or moderate pain. , Disp: , Rfl:  .  levothyroxine (SYNTHROID, LEVOTHROID) 137 MCG tablet, Take 137 mcg by mouth daily before breakfast., Disp: , Rfl:  .  propranolol (INDERAL) 10 MG tablet, Take 20 mg by mouth 3 (three) times daily., Disp: , Rfl:  .  rosuvastatin (CRESTOR) 20 MG tablet, Take 1 tablet (20 mg total) by mouth daily., Disp: 30 tablet, Rfl: 3    ALLERGIES   Tramadol     REVIEW OF SYSTEMS   Review of Systems  Constitutional: Negative for chills, fever, malaise/fatigue and weight loss.  HENT: Negative for congestion.   Eyes: Negative for blurred vision.  Respiratory: Positive for cough, shortness of breath and wheezing. Negative for hemoptysis and sputum production.   Cardiovascular: Negative for chest pain, palpitations, orthopnea and leg swelling.  Gastrointestinal: Negative for heartburn and nausea.  Genitourinary: Negative for dysuria and urgency.  Musculoskeletal: Positive for myalgias. Negative for back pain.  Skin: Negative for rash.  Neurological: Positive for dizziness and headaches.  All other systems reviewed and are negative.    VS: BP 132/82 (BP Location: Left Arm, Cuff Size: Large)   Pulse 68   Resp 16   Ht 5\' 3"  (1.6 m)   Wt 204 lb (92.5 kg)   SpO2 95%   BMI 36.14 kg/m      PHYSICAL EXAM  Physical Exam  Constitutional: She is oriented to person, place,  and time. No distress.  HENT:  Mouth/Throat: No oropharyngeal exudate.  Eyes: Pupils are equal, round, and reactive to light.  Cardiovascular: Normal rate, regular rhythm and normal heart sounds.  No murmur heard. Pulmonary/Chest: No stridor. No respiratory distress. She has no wheezes.  Musculoskeletal: Normal range of motion. She exhibits no edema.  Neurological: She is alert and oriented to person, place, and time.  Skin: Skin is warm and dry. She is not diaphoretic.  Psychiatric: She has a normal mood and affect.       IMAGING    No results found.     ASSESSMENT/PLAN   52 yo white female seen today for follow up. All tests are WNL. Patient re-assured Signs and symptoms of reactive airways disease  Acute respiratory distress, with some mild dizziness and dyspnea at rest. History of asthma. History of lung nodule.  Uncertain if new symptoms could be representative of pulmonary embolism, versus exacerbation of asthma.  We will send for stat CT chest to rule out pulmonary embolism and also to evaluate the patient's known lung nodule which is now overdue for reevaluation.  Discussed with patient that if findings are negative for any acute respiratory issues, then we will assume that this is a flareup of her asthma, she is given a prescription for Advair.  Orders Placed This Encounter  Procedures  . CT CHEST W CONTRAST   Meds ordered this encounter  Medications  . fluticasone-salmeterol (ADVAIR HFA) 115-21 MCG/ACT inhaler    Sig: Inhale 2 puffs into the lungs 2 (two) times daily. Rinse mouth after use.    Dispense:  1 Inhaler    Refill:  12   Return in about 1 month (around 09/05/2017).  Marda Stalker, MD.   Board Certified in Internal Medicine, Pulmonary Medicine, [place], and Sleep Medicine.  [facility] Office Number: [phone] Pager: [phone]  Patricia Pesa, M.D.  Merton Border, M.D

## 2017-08-24 ENCOUNTER — Telehealth: Payer: Self-pay | Admitting: Cardiovascular Disease

## 2017-08-24 NOTE — Telephone Encounter (Signed)
I called and spoke with the patient.  She saw pulmonary a few weeks ago for SOB and to follow up a previously known nodule. CT was ok per pulmonary- the patient was instructed to use inhalers for a possible asthma flare up, and if not any better to call her PCP to follow up. In speaking with the patient today, she does feel like the inhalers have opened up her lungs, but she feels like she is still struggling to breathe. She has tried these for 2 weeks. Her legs feel weak and she is having night sweats. She reports an occasional burning sensation on the left side of her chest.  O2 sats at home are 95%  The patient states she has not followed up with her PCP, Dr. Rosario Jacks. She reports a history of anemia, hypothyroidism, and acid reflux. I have advised the patient that any one of those things could be causing the symptoms that she is having and recommended that she follow up with her PCP for further evaluation of her symptoms. She had a negative stress test in 2017. The patient voices understanding and his agreeable.

## 2017-08-24 NOTE — Telephone Encounter (Signed)
Pt c/o Shortness Of Breath: STAT if SOB developed within the last 24 hours or pt is noticeably SOB on the phone  1. Are you currently SOB (can you hear that pt is SOB on the phone)? yes  2. How long have you been experiencing SOB? 2 weeks  3. Are you SOB when sitting or when up moving around? All the time  4. Are you currently experiencing any other symptoms? Just extremely tired. , sweating a lot/ denies any CP, nausea, vomiting. Some dizziness.  States she eats Tums all the time.

## 2017-09-07 ENCOUNTER — Other Ambulatory Visit
Admission: RE | Admit: 2017-09-07 | Discharge: 2017-09-07 | Disposition: A | Discharge status: Home / Self Care | Payer: BLUE CROSS/BLUE SHIELD | Source: Ambulatory Visit | Attending: Internal Medicine | Admitting: Internal Medicine

## 2017-09-07 ENCOUNTER — Ambulatory Visit: Payer: BLUE CROSS/BLUE SHIELD | Admitting: Internal Medicine

## 2017-09-07 VITALS — BP 138/90 | HR 73 | Ht 63.0 in | Wt 204.0 lb

## 2017-09-07 DIAGNOSIS — J4540 Moderate persistent asthma, uncomplicated: Secondary | ICD-10-CM | POA: Diagnosis not present

## 2017-09-07 LAB — CBC WITH DIFFERENTIAL/PLATELET
Basophils Absolute: 0.1 10*3/uL (ref 0–0.1)
Basophils Relative: 1 %
EOS ABS: 0.2 10*3/uL (ref 0–0.7)
EOS PCT: 3 %
HCT: 41.7 % (ref 35.0–47.0)
Hemoglobin: 14.9 g/dL (ref 12.0–16.0)
LYMPHS ABS: 2.2 10*3/uL (ref 1.0–3.6)
LYMPHS PCT: 23 %
MCH: 34.1 pg — AB (ref 26.0–34.0)
MCHC: 35.6 g/dL (ref 32.0–36.0)
MCV: 95.7 fL (ref 80.0–100.0)
MONO ABS: 0.7 10*3/uL (ref 0.2–0.9)
MONOS PCT: 7 %
Neutro Abs: 6.3 10*3/uL (ref 1.4–6.5)
Neutrophils Relative %: 66 %
PLATELETS: 305 10*3/uL (ref 150–440)
RBC: 4.36 MIL/uL (ref 3.80–5.20)
RDW: 11.8 % (ref 11.5–14.5)
WBC: 9.5 10*3/uL (ref 3.6–11.0)

## 2017-09-07 MED ORDER — ALBUTEROL SULFATE HFA 108 (90 BASE) MCG/ACT IN AERS: 1.0000 | INHALATION_SPRAY | Freq: Four times a day (QID) | RESPIRATORY_TRACT | 5 refills | Status: DC | PRN

## 2017-09-07 NOTE — Progress Notes (Signed)
[facility] [place] [facility] Consultation       Date: 09/07/2017,   MRN# [id number] Kimberly Rivas 1965-06-06 Code Status:  Hosp day:@LENGTHOFSTAYDAYS @ Referring MD: @ATDPROV @     PCP:      Admission                  Current  Kimberly Rivas  is a 52 y.o. old female seen in consultation for SOB, ?pulm nodule.     CHIEF COMPLAINT:   Follow SOB   HISTORY OF PRESENT ILLNESS   The patient    9pronounced "Right-sill" 102 is a 52 year old female who normally sees Dr. Mortimer Fries for history of asthmatic symptoms with occasional triggers such as irritants and strong scents, she was also noted to have a lung nodule for which she was getting surveillance CT scanning. Last visit she was sent for CT chest due to dyspnea and chest pain, this was negative for pulmonary embolism.  She was restarted on her inhalers.   She again notes that she has dyspnea with walking up the 3 flights of stairs and had trouble catching her breath. She notes that she also loses her breath with just sitting sometimes. She is using advair twice daily which she things is helping.  She has gained about 30 lbs in past 6 months, and is hypothyroid. She takes nexium for reflux but still gets symptoms with certain foods. She eats dinner early at 6 pm, trying not to eat after that.  No pets at home. Denies sinus drainage. Denies problems with allergies.   I personally reviewed Ct chest images on 08/05/2017 stable RUL lung nodule approx 3 MM  OV 08/16/2015 with Dr. Mortimer Fries: Patient here for follow up test results, no new complaints Sleep study WNL ONO wnl PFT ratio 84% WNL, Fev1 84% FVC 79% 6MWT wnl  Abs eos 04/13/15; 400  I personally reviewed Stat CT angio images 08/05/2017 and radiology interpretation. There is no evidence of PE or pneumonia. The previously seen nodular area is unchanged. Coronary artery calcifications present.  D/w patient recommend that she try her advair inhaler for 1 week, if not improved should see her  PCP for further eval.       Current Medication:  Current Outpatient Medications:  .  aspirin EC 81 MG tablet, Take 81 mg by mouth daily., Disp: , Rfl:  .  esomeprazole (NEXIUM) 20 MG capsule, Take 1 capsule (20 mg total) by mouth daily., Disp: 30 capsule, Rfl: 0 .  fluticasone-salmeterol (ADVAIR HFA) 115-21 MCG/ACT inhaler, Inhale 2 puffs into the lungs 2 (two) times daily. Rinse mouth after use., Disp: 1 Inhaler, Rfl: 12 .  ibuprofen (ADVIL,MOTRIN) 200 MG tablet, Take 600 mg by mouth every 6 (six) hours as needed for mild pain or moderate pain. , Disp: , Rfl:  .  levothyroxine (SYNTHROID, LEVOTHROID) 137 MCG tablet, Take 137 mcg by mouth daily before breakfast., Disp: , Rfl:  .  propranolol (INDERAL) 10 MG tablet, Take 20 mg by mouth 3 (three) times daily., Disp: , Rfl:  .  rosuvastatin (CRESTOR) 20 MG tablet, Take 1 tablet (20 mg total) by mouth daily., Disp: 30 tablet, Rfl: 3    ALLERGIES   Hydrocodone and Tramadol     REVIEW OF SYSTEMS   Review of Systems  Constitutional: Negative for chills, fever, malaise/fatigue and weight loss.  HENT: Negative for congestion.   Eyes: Negative for blurred vision.  Respiratory: Positive for cough, shortness of breath and wheezing. Negative for hemoptysis and sputum  production.   Cardiovascular: Negative for chest pain, palpitations, orthopnea and leg swelling.  Gastrointestinal: Negative for heartburn and nausea.  Genitourinary: Negative for dysuria and urgency.  Musculoskeletal: Positive for myalgias. Negative for back pain.  Skin: Negative for rash.  Neurological: Positive for dizziness and headaches.  All other systems reviewed and are negative.    VS: BP 138/90 (BP Location: Left Arm, Cuff Size: Normal)   Pulse 73   Ht 5\' 3"  (1.6 m)   Wt 204 lb (92.5 kg)   SpO2 95%   BMI 36.14 kg/m      PHYSICAL EXAM  Physical Exam  Constitutional: She is oriented to person, place, and time. No distress.  HENT:  Mouth/Throat: No  oropharyngeal exudate.  Eyes: Pupils are equal, round, and reactive to light.  Cardiovascular: Normal rate, regular rhythm and normal heart sounds.  No murmur heard. Pulmonary/Chest: No stridor. No respiratory distress. She has no wheezes.  Musculoskeletal: Normal range of motion. She exhibits no edema.  Neurological: She is alert and oriented to person, place, and time.  Skin: Skin is warm and dry. She is not diaphoretic.  Psychiatric: She has a normal mood and affect.       IMAGING    No results found.     ASSESSMENT/PLAN   52 yo white female seen today for follow up. All tests are WNL. Patient re-assured Signs and symptoms of reactive airways disease  Severe persistent asthma. History of lung nodule-stable. Dyspnea on exertion. GERD  Patient advised not to eat or drink 4 hours before bedtime.  Asked to continue using Advair prescribed albuterol inhaler to be used as needed. Follow-up with cardiology to rule out cardiac disease as a possible contributor to her dyspnea. We discussed that at least some of her dyspnea may be secondary to deconditioning and with her recent weight gain of 30 pounds.  I recommended that if she is cleared by cardiology, she can increase her activity level, we discussed that some dyspnea is acceptable as long she can find a happy medium between dyspnea and continued activity.  Orders Placed This Encounter  Procedures  . CBC with Differential/Platelet  . IgE   Return in about 6 months (around 03/09/2018) for follow up with Dr. Mortimer Fries. Marda Stalker, MD.   Board Certified in Internal Medicine, Pulmonary Medicine, [place], and Sleep Medicine.  [facility] Office Number: [phone] Pager: [phone]  Patricia Pesa, M.D.  Merton Border, M.D

## 2017-09-07 NOTE — Patient Instructions (Addendum)
Follow up with your cardiologist to make sure your heart is ok.   Do not eat or drink 4 hours before bedtime.  Will check blood work to look for evidence of allergies.

## 2017-09-12 LAB — IGE: IgE (Immunoglobulin E), Serum: 25 IU/mL (ref 6–495)

## 2017-12-14 ENCOUNTER — Emergency Department: Payer: BLUE CROSS/BLUE SHIELD

## 2017-12-14 ENCOUNTER — Other Ambulatory Visit: Payer: Self-pay

## 2017-12-14 ENCOUNTER — Emergency Department
Admission: EM | Admit: 2017-12-14 | Discharge: 2017-12-14 | Disposition: A | Discharge status: Home / Self Care | Payer: BLUE CROSS/BLUE SHIELD | Attending: Emergency Medicine | Admitting: Emergency Medicine

## 2017-12-14 DIAGNOSIS — M19012 Primary osteoarthritis, left shoulder: Secondary | ICD-10-CM | POA: Diagnosis not present

## 2017-12-14 DIAGNOSIS — R0789 Other chest pain: Secondary | ICD-10-CM

## 2017-12-14 DIAGNOSIS — M62838 Other muscle spasm: Secondary | ICD-10-CM

## 2017-12-14 DIAGNOSIS — I10 Essential (primary) hypertension: Secondary | ICD-10-CM | POA: Diagnosis not present

## 2017-12-14 DIAGNOSIS — J45909 Unspecified asthma, uncomplicated: Secondary | ICD-10-CM | POA: Insufficient documentation

## 2017-12-14 DIAGNOSIS — R079 Chest pain, unspecified: Secondary | ICD-10-CM | POA: Diagnosis present

## 2017-12-14 DIAGNOSIS — M19019 Primary osteoarthritis, unspecified shoulder: Secondary | ICD-10-CM

## 2017-12-14 LAB — TSH: TSH: 2.794 u[IU]/mL (ref 0.350–4.500)

## 2017-12-14 LAB — CBC
HCT: 43.2 % (ref 35.0–47.0)
Hemoglobin: 15.2 g/dL (ref 12.0–16.0)
MCH: 32.8 pg (ref 26.0–34.0)
MCHC: 35.2 g/dL (ref 32.0–36.0)
MCV: 93.1 fL (ref 80.0–100.0)
PLATELETS: 310 10*3/uL (ref 150–440)
RBC: 4.64 MIL/uL (ref 3.80–5.20)
RDW: 11.6 % (ref 11.5–14.5)
WBC: 9.4 10*3/uL (ref 3.6–11.0)

## 2017-12-14 LAB — BASIC METABOLIC PANEL
Anion gap: 8 (ref 5–15)
BUN: 18 mg/dL (ref 6–20)
CO2: 26 mmol/L (ref 22–32)
CREATININE: 0.78 mg/dL (ref 0.44–1.00)
Calcium: 9.5 mg/dL (ref 8.9–10.3)
Chloride: 105 mmol/L (ref 98–111)
GFR calc Af Amer: >60 mL/min (ref >60)
GLUCOSE: 102 mg/dL — AB (ref 70–99)
Potassium: 3.9 mmol/L (ref 3.5–5.1)
SODIUM: 139 mmol/L (ref 135–145)

## 2017-12-14 LAB — HEPATIC FUNCTION PANEL
ALT: 32 U/L (ref 0–44)
AST: 24 U/L (ref 15–41)
Albumin: 4.1 g/dL (ref 3.5–5.0)
Alkaline Phosphatase: 95 U/L (ref 38–126)
BILIRUBIN TOTAL: 0.9 mg/dL (ref 0.3–1.2)
Bilirubin, Direct: <0.1 mg/dL (ref 0.0–0.2)
TOTAL PROTEIN: 7.4 g/dL (ref 6.5–8.1)

## 2017-12-14 LAB — T4, FREE: FREE T4: 1.03 ng/dL (ref 0.82–1.77)

## 2017-12-14 LAB — TROPONIN I: Troponin I: <0.03 ng/mL (ref <0.03)

## 2017-12-14 MED ORDER — KETOROLAC TROMETHAMINE 30 MG/ML IJ SOLN
INTRAMUSCULAR | Status: AC
  Administered 2017-12-14: 30 mg via INTRAMUSCULAR
  Filled 2017-12-14: qty 1

## 2017-12-14 MED ORDER — LIDOCAINE 5 % EX PTCH: 1.0000 | MEDICATED_PATCH | Freq: Two times a day (BID) | CUTANEOUS | 0 refills | Status: AC

## 2017-12-14 MED ORDER — CYCLOBENZAPRINE HCL 10 MG PO TABS: 10.0000 mg | ORAL_TABLET | Freq: Three times a day (TID) | ORAL | 0 refills | Status: DC | PRN

## 2017-12-14 MED ORDER — LIDOCAINE HCL (PF) 1 % IJ SOLN
5.0000 mL | Freq: Once | INTRAMUSCULAR | Status: DC
  Filled 2017-12-14: qty 5

## 2017-12-14 MED ORDER — LIDOCAINE HCL (PF) 1 % IJ SOLN
INTRAMUSCULAR | Status: AC
  Filled 2017-12-14: qty 5

## 2017-12-14 MED ORDER — LIDOCAINE 5 % EX PTCH
MEDICATED_PATCH | CUTANEOUS | Status: AC
  Administered 2017-12-14: 1 via TRANSDERMAL
  Filled 2017-12-14: qty 1

## 2017-12-14 MED ORDER — LIDOCAINE 5 % EX PTCH
1.0000 | MEDICATED_PATCH | Freq: Once | CUTANEOUS | Status: DC
  Administered 2017-12-14: 1 via TRANSDERMAL

## 2017-12-14 MED ORDER — IBUPROFEN 600 MG PO TABS: 600.0000 mg | ORAL_TABLET | Freq: Three times a day (TID) | ORAL | 0 refills | Status: DC | PRN

## 2017-12-14 MED ORDER — KETOROLAC TROMETHAMINE 30 MG/ML IJ SOLN
30.0000 mg | Freq: Once | INTRAMUSCULAR | Status: AC
  Administered 2017-12-14: 30 mg via INTRAMUSCULAR

## 2017-12-14 NOTE — ED Notes (Signed)
ED Provider at bedside. 

## 2017-12-14 NOTE — ED Provider Notes (Signed)
[facility] Emergency Department Provider Note  ____________________________________________   First MD Initiated Contact with Patient 12/14/17 0139     (approximate)  I have reviewed the triage vital signs and the nursing notes.   HISTORY  Chief Complaint Chest Pain   HPI Kimberly Rivas is a 52 y.o. female who comes to the emergency department with left-sided chest pain radiating up into the left shoulder neck and down the left arm that began several days ago.  No particular trauma.  The pain is sharp and "electric".  Has taken over-the-counter pain medications with minimal relief.  Has numbness but no weakness.  Pain was not sudden onset.  Is clearly worse when abducting the shoulder above 90 degrees.   Symptoms are moderate to severe.  They are positional.  They seem to radiate from the chest down the shoulder.   Past Medical History:  Diagnosis Date  . Anemia    H/O  . Anxiety   . Asthma   . Atypical chest pain   . Dysrhythmia   . GERD (gastroesophageal reflux disease)   . HLD (hyperlipidemia)   . Hypertension   . Hypothyroid    a. dx'd at age 28  . Obese   . Palpitations    a. 48 hr Holter 02/2015: NSR, rare PACs and PVCs. Otherwise, no significant arrhythmia   . Shortness of breath dyspnea    RELATED TO ANXIETY PER PT-PT STATES SHE CAN WALK A MILE WITHOUT GETTING SOB (05-09-15)PT IS SEEING DR Mortimer Fries FOR A PULMONARY NODULE SEEN ON CT    Patient Active Problem List   Diagnosis Date Noted  . Hypothyroid   . Atypical chest pain   . Obese   . Chest pressure 02/16/2015  . Palpitations 02/16/2015    Past Surgical History:  Procedure Laterality Date  . BREAST REDUCTION SURGERY    . CESAREAN SECTION     X3  . COLONOSCOPY    . KNEE ARTHROSCOPY Left 05/10/2015   Procedure: ARTHROSCOPY KNEE, partial medial meniscectomy;  Surgeon: Hessie Knows, MD;  Location: [facility] ORS;  Service: Orthopedics;  Laterality: Left;  . REDUCTION MAMMAPLASTY Bilateral  1998    Prior to Admission medications   Medication Sig Start Date End Date Taking? Authorizing Provider  albuterol (PROAIR HFA) 108 (90 Base) MCG/ACT inhaler Inhale 1-2 puffs into the lungs every 6 (six) hours as needed for wheezing or shortness of breath. 09/07/17   Laverle Hobby, MD  aspirin EC 81 MG tablet Take 81 mg by mouth daily.    [provider]  cyclobenzaprine (FLEXERIL) 10 MG tablet Take 1 tablet (10 mg total) by mouth 3 (three) times daily as needed for muscle spasms. 12/14/17   Darel Hong, MD  esomeprazole (NEXIUM) 20 MG capsule Take 1 capsule (20 mg total) by mouth daily. 05/13/15 09/07/17  Drenda Freeze, MD  fluticasone-salmeterol (ADVAIR HFA) [phone] MCG/ACT inhaler Inhale 2 puffs into the lungs 2 (two) times daily. Rinse mouth after use. 08/05/17   Laverle Hobby, MD  ibuprofen (ADVIL,MOTRIN) 600 MG tablet Take 1 tablet (600 mg total) by mouth every 8 (eight) hours as needed. 12/14/17   Darel Hong, MD  levothyroxine (SYNTHROID, LEVOTHROID) 137 MCG tablet Take 137 mcg by mouth daily before breakfast.    [provider]  lidocaine (LIDODERM) 5 % Place 1 patch onto the skin every 12 (twelve) hours. Remove & Discard patch within 12 hours or as directed by MD 12/14/17 12/14/18  Darel Hong, MD  propranolol (  INDERAL) 10 MG tablet Take 20 mg by mouth 3 (three) times daily.    [provider]  rosuvastatin (CRESTOR) 20 MG tablet Take 1 tablet (20 mg total) by mouth daily. 01/09/17 04/09/17  Wellington Hampshire, MD    Allergies Hydrocodone and Tramadol  Family History  Problem Relation Age of Onset  . Acute myelogenous leukemia Mother   . COPD Mother   . Heart Problems Mother   . Hypertension Mother   . Hypothyroidism Mother   . Stroke Mother   . Stroke Father   . Epilepsy Brother   . Cancer Paternal Aunt   . Heart Problems Paternal Grandmother   . Heart Problems Paternal Grandfather     Social History Social History   Tobacco Use   . Smoking status: Never Smoker  . Smokeless tobacco: Never Used  Substance Use Topics  . Alcohol use: Not Currently  . Drug use: No    Review of Systems Constitutional: No fever/chills Eyes: No visual changes. ENT: No sore throat. Cardiovascular: Positive for chest pain. Respiratory: Denies shortness of breath. Gastrointestinal: No abdominal pain.  No nausea, no vomiting.  No diarrhea.  No constipation. Genitourinary: Negative for dysuria. Musculoskeletal: Positive for neck and shoulder pain Skin: Negative for rash. Neurological: Negative for headaches, focal weakness or numbness.   ____________________________________________   PHYSICAL EXAM:  VITAL SIGNS: ED Triage Vitals  Enc Vitals Group     BP 12/14/17 0132 (!) 147/86     Pulse Rate 12/14/17 0132 63     Resp 12/14/17 0132 18     Temp 12/14/17 0132 98.5 F (36.9 C)     Temp src --      SpO2 12/14/17 0132 97 %     Weight 12/14/17 0128 200 lb (90.7 kg)     Height 12/14/17 0128 5\' 3"  (1.6 m)     Head Circumference --      Peak Flow --      Pain Score 12/14/17 0128 10     Pain Loc --      Pain Edu? --      Excl. in [place]? --     Constitutional: Certain oriented x4 appears quite uncomfortable nontoxic no diaphoresis speaks full clear sentences Eyes: PERRL EOMI. Head: Atraumatic. Nose: No congestion/rhinnorhea. Mouth/Throat: No trismus Neck: No stridor.   Cardiovascular: Normal rate, regular rhythm. Grossly normal heart sounds.  Good peripheral circulation. Respiratory: Normal respiratory effort.  No retractions. Lungs CTAB and moving good air Gastrointestinal: Soft nontender Musculoskeletal: Quite tender over the [facility] joint on the left.  Exquisite discomfort when ranging the shoulder above 90 degrees.  Can fully internally and externally rotate without complication Neurologic:  Normal speech and language. No gross focal neurologic deficits are appreciated. Skin:  Skin is warm, dry and intact. No rash  noted. Psychiatric: Mood and affect are normal. Speech and behavior are normal.    ____________________________________________   DIFFERENTIAL includes but not limited to  Bursitis, tendinitis, location, rotator cuff tear, radicular pain, acute coronary syndrome ____________________________________________   LABS (all labs ordered are listed, but only abnormal results are displayed)  Labs Reviewed  BASIC METABOLIC PANEL - Abnormal; Notable for the following components:      Result Value   Glucose, Bld 102 (*)    All other components within normal limits  CBC  TROPONIN I  HEPATIC FUNCTION PANEL  TSH  T4, FREE    Lab work reviewed by me including thyroid studies with no acute disease __________________________________________  EKG  ED ECG REPORT I, Darel Hong, the attending physician, personally viewed and interpreted this ECG.  Date: 12/17/2017 EKG Time:  Rate: 73 Rhythm: normal sinus rhythm QRS Axis: Borderline leftward axis Intervals: normal ST/T Wave abnormalities: normal Narrative Interpretation: no evidence of acute ischemia  ____________________________________________  RADIOLOGY  Chest x-ray reviewed by me with no acute disease Shoulder x-ray reviewed by me with chronic changes around the [facility] joint but no acute disease ____________________________________________   PROCEDURES  Procedure(s) performed: no  Procedures  Critical Care performed: no  ____________________________________________   INITIAL IMPRESSION / ASSESSMENT AND PLAN / ED COURSE  Pertinent labs & imaging results that were available during my care of the patient were reviewed by me and considered in my medical decision making (see chart for details).   As part of my medical decision making, I reviewed the following data within the [facility] History obtained from family if available, nursing notes, old chart and ekg, as well as notes from prior ED  visits.  The patient arrives with chest neck and left shoulder pain.  On exam her history is most consistent with shoulder pathology.  X-rays are pending but I will give her 30 mg of IM ketorolac now.  Following ketorolac the patient's symptoms are significantly improved.  I will discharge her home with a short course of cyclobenzaprine and nonsteroidals and refer her to orthopedic surgery.  Strict return precautions have been given.      ____________________________________________   FINAL CLINICAL IMPRESSION(S) / ED DIAGNOSES  Final diagnoses:  Osteoarthritis of AC (acromioclavicular) joint  Muscle spasm  Atypical chest pain      NEW MEDICATIONS STARTED DURING THIS VISIT:  Discharge Medication List as of 12/14/2017  2:35 AM    START taking these medications   Details  cyclobenzaprine (FLEXERIL) 10 MG tablet Take 1 tablet (10 mg total) by mouth 3 (three) times daily as needed for muscle spasms., Starting Mon 12/14/2017, Print    lidocaine (LIDODERM) 5 % Place 1 patch onto the skin every 12 (twelve) hours. Remove & Discard patch within 12 hours or as directed by MD, Starting Mon 12/14/2017, Until Tue 12/14/2018, Print         Note:  This document was prepared using Dragon voice recognition software and may include unintentional dictation errors.     Darel Hong, MD 12/17/17 2231

## 2017-12-14 NOTE — Discharge Instructions (Addendum)
Please take your pain and muscle relaxation medication as needed for severe symptoms and follow-up with your primary care physician for reevaluation.  Return to the emergency department sooner for any concerns.  It was a pleasure to take care of you today, and thank you for coming to our emergency department.  If you have any questions or concerns before leaving please ask the nurse to grab me and I'm more than happy to go through your aftercare instructions again.  If you were prescribed any opioid pain medication today such as Norco, Vicodin, Percocet, morphine, hydrocodone, or oxycodone please make sure you do not drive when you are taking this medication as it can alter your ability to drive safely.  If you have any concerns once you are home that you are not improving or are in fact getting worse before you can make it to your follow-up appointment, please do not hesitate to call 911 and come back for further evaluation.  Darel Hong, MD  Results for orders placed or performed during the hospital encounter of 62/26/33  Basic metabolic panel  Result Value Ref Range   Sodium 139 135 - 145 mmol/L   Potassium 3.9 3.5 - 5.1 mmol/L   Chloride 105 98 - 111 mmol/L   CO2 26 22 - 32 mmol/L   Glucose, Bld 102 (H) 70 - 99 mg/dL   BUN 18 6 - 20 mg/dL   Creatinine, Ser 0.78 0.44 - 1.00 mg/dL   Calcium 9.5 8.9 - 10.3 mg/dL   GFR calc non Af Amer >60 >60 mL/min   GFR calc Af Amer >60 >60 mL/min   Anion gap 8 5 - 15  CBC  Result Value Ref Range   WBC 9.4 3.6 - 11.0 K/uL   RBC 4.64 3.80 - 5.20 MIL/uL   Hemoglobin 15.2 12.0 - 16.0 g/dL   HCT 43.2 35.0 - 47.0 %   MCV 93.1 80.0 - 100.0 fL   MCH 32.8 26.0 - 34.0 pg   MCHC 35.2 32.0 - 36.0 g/dL   RDW 11.6 11.5 - 14.5 %   Platelets 310 150 - 440 K/uL  Troponin I  Result Value Ref Range   Troponin I <0.03 <0.03 ng/mL   Dg Chest 2 View  Result Date: 12/14/2017 CLINICAL DATA:  LEFT side chest pain radiating to LEFT shoulder and neck and down  LEFT arm, symptoms since Friday, history of GERD, hypertension, EXAM: CHEST - 2 VIEW COMPARISON:  05/13/2015; correlation interval CT chest 08/05/2017 FINDINGS: Normal heart size, mediastinal contours, and pulmonary vascularity. Chronic basilar changes/scarring. No acute infiltrate, pleural effusion or pneumothorax. Bones unremarkable. IMPRESSION: Chronic bibasilar changes. No acute abnormalities. Electronically Signed   By: Lavonia Dana M.D.   On: 12/14/2017 02:14   Dg Shoulder Left  Result Date: 12/14/2017 CLINICAL DATA:  LEFT side chest pain radiating up to LEFT shoulder and neck and down LEFT arm, shoulder pain with movement, no increased shortness of breath EXAM: LEFT SHOULDER - 2+ VIEW COMPARISON:  None FINDINGS: Osseous demineralization. Degenerative changes LEFT AC joint. No fracture, dislocation or bone destruction. Visualized LEFT ribs unremarkable. IMPRESSION: No acute abnormalities. Osseous demineralization with degenerative changes of LEFT AC joint. Electronically Signed   By: Lavonia Dana M.D.   On: 12/14/2017 02:15

## 2017-12-14 NOTE — ED Triage Notes (Signed)
Patient to ED for left sided chest pain that radiates up into the left shoulder and neck and down the left arm. Has been present since Friday and pain medications have not "touched it." Patient notes no increased shortness of breath with chest pain episode. Patient describes pain as aching.

## 2018-01-29 ENCOUNTER — Other Ambulatory Visit: Payer: Self-pay | Admitting: Internal Medicine

## 2018-01-29 DIAGNOSIS — Z1231 Encounter for screening mammogram for malignant neoplasm of breast: Secondary | ICD-10-CM

## 2018-02-10 ENCOUNTER — Ambulatory Visit
Admission: RE | Admit: 2018-02-10 | Discharge: 2018-02-10 | Disposition: A | Discharge status: Home / Self Care | Payer: BLUE CROSS/BLUE SHIELD | Source: Ambulatory Visit | Attending: Internal Medicine | Admitting: Internal Medicine

## 2018-02-10 DIAGNOSIS — Z1231 Encounter for screening mammogram for malignant neoplasm of breast: Secondary | ICD-10-CM | POA: Diagnosis present

## 2018-07-05 ENCOUNTER — Other Ambulatory Visit: Payer: Self-pay | Admitting: Internal Medicine

## 2018-07-05 ENCOUNTER — Telehealth: Payer: Self-pay | Admitting: Internal Medicine

## 2018-07-05 MED ORDER — AZITHROMYCIN 250 MG PO TABS: 250.0000 mg | ORAL_TABLET | Freq: Every day | ORAL | 0 refills | Status: DC

## 2018-07-05 MED ORDER — PREDNISONE 10 MG (21) PO TBPK: ORAL_TABLET | ORAL | 0 refills | Status: DC

## 2018-07-05 NOTE — Telephone Encounter (Signed)
[facility], [facility], needs to know diagnoses to fill meds. Cb is [phone]

## 2018-07-05 NOTE — Telephone Encounter (Signed)
Spoke to [employer] at [place], gave her diagnosis of acute bronchitis J20.9.  Called and spoke to patient, made her aware of Dr. Mathis Fare recommendations. She stated she is almost done with Zpack her primary MD had sent in for her. She will get pred taper and see how she feels. Nothing further needed at this time.

## 2018-07-05 NOTE — Telephone Encounter (Signed)
Sent script for pred taper and z pack. Give the medicine 24 hrs, if not better go to ED. Use Rescue inhaler 2 puffs every 6 hours until then even if she is feeling ok.

## 2018-10-19 ENCOUNTER — Other Ambulatory Visit: Payer: Self-pay | Admitting: Internal Medicine

## 2018-10-25 ENCOUNTER — Other Ambulatory Visit: Payer: Self-pay | Admitting: Internal Medicine

## 2018-11-14 NOTE — Progress Notes (Signed)
[facility] Consultation   Virtual Visit via Video Note I connected with patient on 11/15/18 at 10:30 AM EDT by video and verified that I am speaking with the correct person using two identifiers.   I discussed the limitations, risks of performing an evaluation and management service by video and the availability of in person appointments. I also discussed with the patient that there may be a patient responsible charge related to this service.  In light of current covid-19 pandemic, patient also understands that we are trying to protect them by minimizing in office contact if at all possible.  The patient expressed understanding and agreed to proceed. Please see note below for further detail.    The patient was advised to call back or seek an in-person evaluation if the symptoms worsen or if the condition fails to improve as anticipated. I spent 15 minutes of face-to-face time during this visit.   Laverle Hobby, MD   ASSESSMENT/PLAN   53 yo white female seen today for follow up. All tests are WNL. Patient re-assured Signs and symptoms of reactive airways disease  Severe persistent asthma. History of lung nodule-stable. Dyspnea on exertion. GERD  Patient advised not to eat or drink 4 hours before bedtime.  Asked to continue using Advair prescribed albuterol inhaler to be used as needed. Follow-up with cardiology to rule out cardiac disease as a possible contributor to her dyspnea. We discussed that at least some of her dyspnea may be secondary to deconditioning and with her recent weight gain of 30 pounds.  I recommended that if she is cleared by cardiology, she can increase her activity level, we discussed that some dyspnea is acceptable as long she can find a happy medium between dyspnea and continued activity.  No orders of the defined types were placed in this encounter.  No follow-ups on file.    Date: 11/14/2018,   MRN# [id number] Kimberly Rivas  April 03, 1966 Code Status:  Hosp day:@LENGTHOFSTAYDAYS @ Referring MD: @ATDPROV @     PCP:      Admission                  Current  Kimberly Rivas  is a 53 y.o. old female seen with chief complaint of dyspnea, asthma.      CHIEF COMPLAINT:   Follow SOB   HISTORY OF PRESENT ILLNESS   Kimberly Rivas (pronounced "Right-sill") is a 53 y.o. female  With history of asthmatic symptoms with occasional triggers such as irritants and strong scents, she was also noted to have a lung nodule for which she was getting surveillance CT scanning. Last visit she was asked to continue using advair, and watch her diet due to possible gerd. She was asked to see cardiology to r/o cardiac disease.   Since her last visit she feels that her breathing has been doing ok, using advair bid, albuterol about once per week.  She is not sleepy during the day.    I personally reviewed Ct chest images on 08/05/2017 stable RUL lung nodule approx 3 MM, unchanged since 2017.   OV 08/16/2015 with Dr. Mortimer Fries: Patient here for follow up test results, no new complaints Sleep study WNL ONO wnl PFT ratio 84% WNL, Fev1 84% FVC 79% 6MWT wnl  Abs eos 04/13/15; 400  I personally reviewed Stat CT angio images 08/05/2017 and radiology interpretation. There is no evidence of PE or pneumonia. The previously seen nodular area is unchanged. Coronary artery calcifications present.  D/w patient recommend  that she try her advair inhaler for 1 week, if not improved should see her PCP for further eval.       Current Medication:  Current Outpatient Medications:    albuterol (PROAIR HFA) 108 (90 Base) MCG/ACT inhaler, Inhale 1-2 puffs into the lungs every 6 (six) hours as needed for wheezing or shortness of breath., Disp: 1 Inhaler, Rfl: 5   aspirin EC 81 MG tablet, Take 81 mg by mouth daily., Disp: , Rfl:    azithromycin (ZITHROMAX) 250 MG tablet, Take 1 tablet (250 mg total) by mouth daily. Take 2 tabs at once on the first day,  then once daily., Disp: 6 tablet, Rfl: 0   cyclobenzaprine (FLEXERIL) 10 MG tablet, Take 1 tablet (10 mg total) by mouth 3 (three) times daily as needed for muscle spasms., Disp: 30 tablet, Rfl: 0   esomeprazole (NEXIUM) 20 MG capsule, Take 1 capsule (20 mg total) by mouth daily., Disp: 30 capsule, Rfl: 0   fluticasone-salmeterol (ADVAIR HFA) 115-21 MCG/ACT inhaler, Inhale 2 puffs into the lungs 2 (two) times daily. Rinse mouth after use., Disp: 1 Inhaler, Rfl: 12   ibuprofen (ADVIL,MOTRIN) 600 MG tablet, Take 1 tablet (600 mg total) by mouth every 8 (eight) hours as needed., Disp: 30 tablet, Rfl: 0   levothyroxine (SYNTHROID, LEVOTHROID) 137 MCG tablet, Take 137 mcg by mouth daily before breakfast., Disp: , Rfl:    lidocaine (LIDODERM) 5 %, Place 1 patch onto the skin every 12 (twelve) hours. Remove & Discard patch within 12 hours or as directed by MD, Disp: 30 patch, Rfl: 0   predniSONE (STERAPRED UNI-PAK 21 TAB) 10 MG (21) TBPK tablet, Take as directed., Disp: 21 tablet, Rfl: 0   propranolol (INDERAL) 10 MG tablet, Take 20 mg by mouth 3 (three) times daily., Disp: , Rfl:    rosuvastatin (CRESTOR) 20 MG tablet, Take 1 tablet (20 mg total) by mouth daily., Disp: 30 tablet, Rfl: 3    ALLERGIES   Hydrocodone and Tramadol     REVIEW OF SYSTEMS  Review of Systems:  Constitutional: Feels well. Cardiovascular: Denies chest pain, exertional chest pain.  Pulmonary: Denies hemoptysis, pleuritic chest pain.   The remainder of systems were reviewed and were found to be negative other than what is documented in the HPI.      PHYSICAL EXAM  --  IMAGING    No results found.       Marda Stalker, M.D., F.C.C.P.  Board Certified in Internal Medicine, Pulmonary Medicine, [place], and Sleep Medicine.  [facility] Office Number: [phone]

## 2018-11-15 ENCOUNTER — Ambulatory Visit (INDEPENDENT_AMBULATORY_CARE_PROVIDER_SITE_OTHER): Payer: BC Managed Care – PPO | Admitting: Internal Medicine

## 2018-11-15 DIAGNOSIS — J4540 Moderate persistent asthma, uncomplicated: Secondary | ICD-10-CM | POA: Diagnosis not present

## 2018-11-15 MED ORDER — FLUTICASONE-SALMETEROL 115-21 MCG/ACT IN AERO: 2.0000 | INHALATION_SPRAY | Freq: Two times a day (BID) | RESPIRATORY_TRACT | 12 refills | Status: DC

## 2018-11-15 NOTE — Patient Instructions (Signed)
Continue to use advair twice daily, rinse mouth after use. Continue to use albuterol rescue inhaler as needed.

## 2019-02-08 ENCOUNTER — Encounter: Payer: Self-pay | Admitting: *Deleted

## 2019-04-06 ENCOUNTER — Ambulatory Visit: Payer: BC Managed Care – PPO | Attending: Internal Medicine

## 2019-04-06 DIAGNOSIS — Z20822 Contact with and (suspected) exposure to covid-19: Secondary | ICD-10-CM

## 2019-04-07 ENCOUNTER — Encounter: Payer: Self-pay | Admitting: Emergency Medicine

## 2019-04-07 ENCOUNTER — Other Ambulatory Visit: Payer: Self-pay

## 2019-04-07 DIAGNOSIS — Z79899 Other long term (current) drug therapy: Secondary | ICD-10-CM | POA: Diagnosis not present

## 2019-04-07 DIAGNOSIS — Z20828 Contact with and (suspected) exposure to other viral communicable diseases: Secondary | ICD-10-CM | POA: Insufficient documentation

## 2019-04-07 DIAGNOSIS — J45909 Unspecified asthma, uncomplicated: Secondary | ICD-10-CM | POA: Diagnosis not present

## 2019-04-07 DIAGNOSIS — I10 Essential (primary) hypertension: Secondary | ICD-10-CM | POA: Diagnosis not present

## 2019-04-07 DIAGNOSIS — R42 Dizziness and giddiness: Secondary | ICD-10-CM | POA: Diagnosis present

## 2019-04-07 DIAGNOSIS — E039 Hypothyroidism, unspecified: Secondary | ICD-10-CM | POA: Diagnosis not present

## 2019-04-07 DIAGNOSIS — Z7982 Long term (current) use of aspirin: Secondary | ICD-10-CM | POA: Diagnosis not present

## 2019-04-07 LAB — CBC WITH DIFFERENTIAL/PLATELET
Abs Immature Granulocytes: 0.04 10*3/uL (ref 0.00–0.07)
Basophils Absolute: 0 10*3/uL (ref 0.0–0.1)
Basophils Relative: 0 %
Eosinophils Absolute: 0.2 10*3/uL (ref 0.0–0.5)
Eosinophils Relative: 2 %
HCT: 39.9 % (ref 36.0–46.0)
Hemoglobin: 14.5 g/dL (ref 12.0–15.0)
Immature Granulocytes: 1 %
Lymphocytes Relative: 40 %
Lymphs Abs: 2.8 10*3/uL (ref 0.7–4.0)
MCH: 32.2 pg (ref 26.0–34.0)
MCHC: 36.3 g/dL — ABNORMAL HIGH (ref 30.0–36.0)
MCV: 88.7 fL (ref 80.0–100.0)
Monocytes Absolute: 0.5 10*3/uL (ref 0.1–1.0)
Monocytes Relative: 8 %
Neutro Abs: 3.5 10*3/uL (ref 1.7–7.7)
Neutrophils Relative %: 49 %
Platelets: 280 10*3/uL (ref 150–400)
RBC: 4.5 MIL/uL (ref 3.87–5.11)
RDW: 10.7 % — ABNORMAL LOW (ref 11.5–15.5)
WBC: 7 10*3/uL (ref 4.0–10.5)
nRBC: 0 % (ref 0.0–0.2)

## 2019-04-07 LAB — TROPONIN I (HIGH SENSITIVITY): Troponin I (High Sensitivity): <2 ng/L (ref <18)

## 2019-04-07 LAB — COMPREHENSIVE METABOLIC PANEL
ALT: 42 U/L (ref 0–44)
AST: 30 U/L (ref 15–41)
Albumin: 4.1 g/dL (ref 3.5–5.0)
Alkaline Phosphatase: 106 U/L (ref 38–126)
Anion gap: 9 (ref 5–15)
BUN: 14 mg/dL (ref 6–20)
CO2: 25 mmol/L (ref 22–32)
Calcium: 9 mg/dL (ref 8.9–10.3)
Chloride: 105 mmol/L (ref 98–111)
Creatinine, Ser: 0.71 mg/dL (ref 0.44–1.00)
GFR calc Af Amer: >60 mL/min (ref >60)
GFR calc non Af Amer: >60 mL/min (ref >60)
Glucose, Bld: 115 mg/dL — ABNORMAL HIGH (ref 70–99)
Potassium: 4 mmol/L (ref 3.5–5.1)
Sodium: 139 mmol/L (ref 135–145)
Total Bilirubin: 0.8 mg/dL (ref 0.3–1.2)
Total Protein: 7.6 g/dL (ref 6.5–8.1)

## 2019-04-07 NOTE — ED Triage Notes (Signed)
Patient ambulatory to triage with steady gait, without difficulty or distress noted, mask in place; pt reports BP 183/160 at home accomp by dizziness

## 2019-04-08 ENCOUNTER — Emergency Department: Payer: BC Managed Care – PPO

## 2019-04-08 ENCOUNTER — Emergency Department
Admission: EM | Admit: 2019-04-08 | Discharge: 2019-04-08 | Disposition: A | Discharge status: Home / Self Care | Payer: BC Managed Care – PPO | Attending: Emergency Medicine | Admitting: Emergency Medicine

## 2019-04-08 DIAGNOSIS — I10 Essential (primary) hypertension: Secondary | ICD-10-CM

## 2019-04-08 DIAGNOSIS — U071 COVID-19: Secondary | ICD-10-CM

## 2019-04-08 LAB — NOVEL CORONAVIRUS, NAA: SARS-CoV-2, NAA: DETECTED — AB

## 2019-04-08 MED ORDER — AZITHROMYCIN 500 MG PO TABS
500.0000 mg | ORAL_TABLET | Freq: Once | ORAL | Status: AC
  Administered 2019-04-08: 05:00:00 500 mg via ORAL
  Filled 2019-04-08: qty 1

## 2019-04-08 MED ORDER — LORAZEPAM 0.5 MG PO TABS
0.5000 mg | ORAL_TABLET | Freq: Once | ORAL | Status: AC
  Administered 2019-04-08: 05:00:00 0.5 mg via ORAL
  Filled 2019-04-08: qty 1

## 2019-04-08 MED ORDER — AZITHROMYCIN 500 MG PO TABS: 500.0000 mg | ORAL_TABLET | Freq: Every day | ORAL | 0 refills | Status: AC

## 2019-04-08 MED ORDER — PREDNISONE 20 MG PO TABS: 60.0000 mg | ORAL_TABLET | Freq: Every day | ORAL | 0 refills | Status: AC

## 2019-04-08 MED ORDER — PREDNISONE 20 MG PO TABS
60.0000 mg | ORAL_TABLET | Freq: Once | ORAL | Status: AC
  Administered 2019-04-08: 60 mg via ORAL
  Filled 2019-04-08: qty 3

## 2019-04-08 MED ORDER — ONDANSETRON 4 MG PO TBDP: 4.0000 mg | ORAL_TABLET | Freq: Three times a day (TID) | ORAL | 0 refills | Status: DC | PRN

## 2019-04-08 NOTE — ED Provider Notes (Signed)
[facility] Emergency Department Provider Note  ____________________________________________   First MD Initiated Contact with Patient 04/08/19 [phone]     (approximate)  I have reviewed the triage vital signs and the nursing notes.   HISTORY  Chief Complaint Dizziness    HPI Kimberly Rivas is a 54 y.o. female with below list of previous medical conditions presents to the emergency department secondary to hypertension with reported blood pressure 183/160 at home.  On arrival to the emergency department patient's blood pressure 173/92.  Patient also admits to generalized myalgia nonproductive cough dizziness times few weeks worsening over the past couple days.  Patient states that her husband was recently tested positive for COVID-19 and that she has been tested and is negative x3.  Patient also admits to feelings of anxiety         Past Medical History:  Diagnosis Date  . Anemia    H/O  . Anxiety   . Asthma   . Atypical chest pain   . Dysrhythmia   . GERD (gastroesophageal reflux disease)   . HLD (hyperlipidemia)   . Hypertension   . Hypothyroid    a. dx'd at age 19  . Obese   . Palpitations    a. 48 hr Holter 02/2015: NSR, rare PACs and PVCs. Otherwise, no significant arrhythmia   . Shortness of breath dyspnea    RELATED TO ANXIETY PER PT-PT STATES SHE CAN WALK A MILE WITHOUT GETTING SOB (05-09-15)PT IS SEEING DR Mortimer Fries FOR A PULMONARY NODULE SEEN ON CT    Patient Active Problem List   Diagnosis Date Noted  . Hypothyroid   . Atypical chest pain   . Obese   . Chest pressure 02/16/2015  . Palpitations 02/16/2015    Past Surgical History:  Procedure Laterality Date  . BREAST REDUCTION SURGERY    . CESAREAN SECTION     X3  . COLONOSCOPY    . KNEE ARTHROSCOPY Left 05/10/2015   Procedure: ARTHROSCOPY KNEE, partial medial meniscectomy;  Surgeon: Hessie Knows, MD;  Location: [facility] ORS;  Service: Orthopedics;  Laterality: Left;  . REDUCTION  MAMMAPLASTY Bilateral 1998    Prior to Admission medications   Medication Sig Start Date End Date Taking? Authorizing Provider  albuterol (PROAIR HFA) 108 (90 Base) MCG/ACT inhaler Inhale 1-2 puffs into the lungs every 6 (six) hours as needed for wheezing or shortness of breath. 09/07/17   Laverle Hobby, MD  aspirin EC 81 MG tablet Take 81 mg by mouth daily.    [provider]  azithromycin (ZITHROMAX) 250 MG tablet Take 1 tablet (250 mg total) by mouth daily. Take 2 tabs at once on the first day, then once daily. 07/05/18   Laverle Hobby, MD  azithromycin (ZITHROMAX) 500 MG tablet Take 1 tablet (500 mg total) by mouth daily for 3 days. Take 1 tablet daily for 3 days. 04/08/19 04/11/19  Gregor Hams, MD  cyclobenzaprine (FLEXERIL) 10 MG tablet Take 1 tablet (10 mg total) by mouth 3 (three) times daily as needed for muscle spasms. 12/14/17   Darel Hong, MD  esomeprazole (NEXIUM) 20 MG capsule Take 1 capsule (20 mg total) by mouth daily. 05/13/15 09/07/17  Drenda Freeze, MD  fluticasone-salmeterol (ADVAIR HFA) [phone] MCG/ACT inhaler Inhale 2 puffs into the lungs 2 (two) times daily. Rinse mouth after use. 11/15/18   Laverle Hobby, MD  ibuprofen (ADVIL,MOTRIN) 600 MG tablet Take 1 tablet (600 mg total) by mouth every 8 (eight) hours as needed. 12/14/17  Darel Hong, MD  levothyroxine (SYNTHROID, LEVOTHROID) 137 MCG tablet Take 137 mcg by mouth daily before breakfast.    [provider]  ondansetron (ZOFRAN ODT) 4 MG disintegrating tablet Take 1 tablet (4 mg total) by mouth every 8 (eight) hours as needed. 04/08/19   Gregor Hams, MD  predniSONE (DELTASONE) 20 MG tablet Take 3 tablets (60 mg total) by mouth daily for 5 days. 04/08/19 04/13/19  Gregor Hams, MD  predniSONE (STERAPRED UNI-PAK 21 TAB) 10 MG (21) TBPK tablet Take as directed. 07/05/18   Laverle Hobby, MD  propranolol (INDERAL) 10 MG tablet Take 20 mg by mouth 3 (three) times daily.     [provider]  rosuvastatin (CRESTOR) 20 MG tablet Take 1 tablet (20 mg total) by mouth daily. 01/09/17 04/09/17  Wellington Hampshire, MD    Allergies Hydrocodone and Tramadol  Family History  Problem Relation Age of Onset  . Acute myelogenous leukemia Mother   . COPD Mother   . Heart Problems Mother   . Hypertension Mother   . Hypothyroidism Mother   . Stroke Mother   . Stroke Father   . Epilepsy Brother   . Cancer Paternal Aunt   . Heart Problems Paternal Grandmother   . Heart Problems Paternal Grandfather   . Breast cancer Neg Hx     Social History Social History   Tobacco Use  . Smoking status: Never Smoker  . Smokeless tobacco: Never Used  Substance Use Topics  . Alcohol use: Not Currently  . Drug use: No    Review of Systems Constitutional: No fever/chills Eyes: No visual changes. ENT: No sore throat. Cardiovascular: Denies chest pain. Respiratory: Denies shortness of breath.  Positive for nonproductive cough Gastrointestinal: No abdominal pain.  No nausea, no vomiting.  No diarrhea.  No constipation. Genitourinary: Negative for dysuria. Musculoskeletal: Positive for generalized muscle aches Integumentary: Negative for rash. Neurological: Negative for headaches, focal weakness or numbness. Psychiatry: Positive for anxiety ____________________________________________   PHYSICAL EXAM:  VITAL SIGNS: ED Triage Vitals  Enc Vitals Group     BP 04/07/19 2256 (!) 173/92     Pulse Rate 04/07/19 2256 62     Resp 04/07/19 2256 18     Temp 04/07/19 2256 97.7 F (36.5 C)     Temp Source 04/07/19 2256 Oral     SpO2 04/07/19 2256 99 %     Weight 04/07/19 2259 90.7 kg (200 lb)     Height 04/07/19 2259 1.6 m (5\' 3" )     Head Circumference --      Peak Flow --      Pain Score 04/07/19 2259 0     Pain Loc --      Pain Edu? --      Excl. in [place]? --     Constitutional: Alert and oriented.  Eyes: Conjunctivae are normal.  Mouth/Throat: Patient is wearing  a mask. Neck: No stridor.  No meningeal signs.   Cardiovascular: Normal rate, regular rhythm. Good peripheral circulation. Grossly normal heart sounds. Respiratory: Normal respiratory effort.  No retractions. Gastrointestinal: Soft and nontender. No distention.  Musculoskeletal: No lower extremity tenderness nor edema. No gross deformities of extremities. Neurologic:  Normal speech and language. No gross focal neurologic deficits are appreciated.  Skin:  Skin is warm, dry and intact. Psychiatric: Anxious affect. Speech and behavior are normal.  ____________________________________________   LABS (all labs ordered are listed, but only abnormal results are displayed)  Labs Reviewed  CBC WITH DIFFERENTIAL/PLATELET -  Abnormal; Notable for the following components:      Result Value   MCHC 36.3 (*)    RDW 10.7 (*)    All other components within normal limits  COMPREHENSIVE METABOLIC PANEL - Abnormal; Notable for the following components:   Glucose, Bld 115 (*)    All other components within normal limits  TROPONIN I (HIGH SENSITIVITY)   ____________________________________________  EKG  ED ECG REPORT I, [place] N Leona Pressly, the attending physician, personally viewed and interpreted this ECG.   Date: 04/07/2019  EKG Time: 11:10 PM  Rate: 58  Rhythm: Sinus bradycardia  Axis: Normal  Intervals: Normal  ST&T Change: None  ____________________________________________  RADIOLOGY I, [place] N Anela Bensman, personally viewed and evaluated these images (plain radiographs) as part of my medical decision making, as well as reviewing the written report by the radiologist.  ED MD interpretation: No active cardiopulmonary disease on chest x-ray per radiologist.  Unremarkable noncontrast CT head per radiologist.  Official radiology report(s): DG Chest 2 View  Result Date: 04/08/2019 CLINICAL DATA:  Chest pain EXAM: CHEST - 2 VIEW COMPARISON:  12/14/2017 FINDINGS: Chronic interstitial opacities  at the bases. No acute consolidation or effusion. Normal heart size. No pneumothorax. IMPRESSION: No active cardiopulmonary disease. Similar appearance of basilar scarring. Electronically Signed   By: Donavan Foil M.D.   On: 04/08/2019 03:31   CT Head Wo Contrast  Result Date: 04/08/2019 CLINICAL DATA:  Dizziness. Altered mental status today. EXAM: CT HEAD WITHOUT CONTRAST TECHNIQUE: Contiguous axial images were obtained from the base of the skull through the vertex without intravenous contrast. COMPARISON:  None. FINDINGS: Brain: No intracranial hemorrhage, mass effect, or midline shift. No hydrocephalus. The basilar cisterns are patent. No evidence of territorial infarct or acute ischemia. No extra-axial or intracranial fluid collection. Vascular: No hyperdense vessel or unexpected calcification. Skull: No fracture or focal lesion. Sinuses/Orbits: Paranasal sinuses and mastoid air cells are clear. The visualized orbits are unremarkable. Other: None. IMPRESSION: Unremarkable noncontrast head CT. Electronically Signed   By: Keith Rake M.D.   On: 04/08/2019 05:56     Procedures   ____________________________________________   INITIAL IMPRESSION / MDM / ASSESSMENT AND PLAN / ED COURSE  As part of my medical decision making, I reviewed the following data within the electronic MEDICAL RECORD NUMBER   54 year old female presented with above-stated history and physical exam differential diagnosis including but not limited to essential hypertension and COVID-19 infection.  I reviewed the patient's test that was performed on 04/06/2019 and noted that the patient was Covid positive results at this morning.  Regarding the patient's hypertension current BP systolic 0000000 in the setting of anxiety as well and such patient given Ativan 0.5 mg.  Repeat blood pressure 136/83.  Spoke with the patient at length regarding warning signs that would warrant immediate return to the emergency department.  Patient be  prescribed prednisone azithromycin and Zofran ODT for home.       ____________________________________________  FINAL CLINICAL IMPRESSION(S) / ED DIAGNOSES  Final diagnoses:  [id number] virus infection  Essential hypertension     MEDICATIONS GIVEN DURING THIS VISIT:  Medications  predniSONE (DELTASONE) tablet 60 mg (60 mg Oral Given 04/08/19 0517)  azithromycin (ZITHROMAX) tablet 500 mg (500 mg Oral Given 04/08/19 0517)  LORazepam (ATIVAN) tablet 0.5 mg (0.5 mg Oral Given 04/08/19 0517)     ED Discharge Orders         Ordered    predniSONE (DELTASONE) 20 MG tablet  Daily  04/08/19 0643    azithromycin (ZITHROMAX) 500 MG tablet  Daily     04/08/19 0643    ondansetron (ZOFRAN ODT) 4 MG disintegrating tablet  Every 8 hours PRN     04/08/19 [id number]          *Please note:  Hollister Lotto Sesler was evaluated in Emergency Department on 04/08/2019 for the symptoms described in the history of present illness. She was evaluated in the context of the global COVID-19 pandemic, which necessitated consideration that the patient might be at risk for infection with the SARS-CoV-2 virus that causes COVID-19. Institutional protocols and algorithms that pertain to the evaluation of patients at risk for COVID-19 are in a state of rapid change based on information released by regulatory bodies including the CDC and federal and state organizations. These policies and algorithms were followed during the patient's care in the ED.  Some ED evaluations and interventions may be delayed as a result of limited staffing during the pandemic.*  Note:  This document was prepared using Dragon voice recognition software and may include unintentional dictation errors.   Gregor Hams, MD 04/08/19 [phone]

## 2019-04-08 NOTE — ED Notes (Signed)
Patient transported to CT 

## 2019-04-08 NOTE — ED Notes (Signed)
Pt alert and oriented X 4, stable for discharge. RR even and unlabored, color WNL. Discussed discharge instructions and follow up when appropriate. Instructed to follow up with ER for any life threatening symptoms or concerns that patient or family of patient may have  

## 2019-04-08 NOTE — ED Notes (Signed)
Pt ambulated around room with no observed difficulty in breathing. 94% on RA.

## 2019-04-08 NOTE — ED Notes (Signed)
Pt sitting in lobby with no distress noted; pt updated on wait time & vs retaken; pt is upset because "other patient went before me because people knew them"; instr pt on purpose of triage and protocols; instr pt that individuals are seen based on their acuity and not time of arrival; instr pt that individuals may go before her based on any abnormal or critical results; pt remains upset and cont to st that a pt went before her based on people they knew here; pt st her husband has [place] and her "lungs hurt"; pt informed that I would be unable to swab her for COVID in the lobby and that I would order a CXR at her request

## 2019-04-08 NOTE — ED Notes (Signed)
ED Provider at bedside. 

## 2019-04-22 ENCOUNTER — Encounter: Payer: Self-pay | Admitting: Adult Health

## 2019-04-22 ENCOUNTER — Other Ambulatory Visit: Payer: Self-pay

## 2019-04-22 ENCOUNTER — Ambulatory Visit: Payer: BC Managed Care – PPO | Admitting: Adult Health

## 2019-04-22 ENCOUNTER — Other Ambulatory Visit
Admission: RE | Admit: 2019-04-22 | Discharge: 2019-04-22 | Disposition: A | Discharge status: Home / Self Care | Payer: BC Managed Care – PPO | Source: Ambulatory Visit | Attending: Adult Health | Admitting: Adult Health

## 2019-04-22 VITALS — BP 126/82 | HR 91 | Temp 97.5°F | Ht 63.0 in | Wt 206.6 lb

## 2019-04-22 DIAGNOSIS — R0789 Other chest pain: Secondary | ICD-10-CM

## 2019-04-22 DIAGNOSIS — R0602 Shortness of breath: Secondary | ICD-10-CM | POA: Insufficient documentation

## 2019-04-22 DIAGNOSIS — R05 Cough: Secondary | ICD-10-CM

## 2019-04-22 DIAGNOSIS — U071 COVID-19: Secondary | ICD-10-CM | POA: Diagnosis not present

## 2019-04-22 DIAGNOSIS — R059 Cough, unspecified: Secondary | ICD-10-CM | POA: Insufficient documentation

## 2019-04-22 LAB — FIBRIN DERIVATIVES D-DIMER (ARMC ONLY): Fibrin derivatives D-dimer (ARMC): 409.72 ng{FEU}/mL (ref 0.00–499.00)

## 2019-04-22 MED ORDER — BENZONATATE 200 MG PO CAPS: 200.0000 mg | ORAL_CAPSULE | Freq: Three times a day (TID) | ORAL | 1 refills | Status: DC | PRN

## 2019-04-22 NOTE — Assessment & Plan Note (Signed)
Suspected post viral cough.  Chest x-ray without associated pneumonia.  Patient is to add in cough suppressants with Delsym and Tessalon  Plan  Patient Instructions  Advair 1 puff twice daily, rinse after use Albuterol as needed Activity as tolerated Fluids and rest Add Pepcid 20mg  daily for 2 weeks  Labs today .  Delsym 2 tsp Twice daily  As needed  Cough  Tessalon Three times a day  As needed  Cough  Follow-up in 3 months with Dr. Patsey Berthold  and As needed   Please contact office for sooner follow up if symptoms do not improve or worsen or seek emergency care

## 2019-04-22 NOTE — Assessment & Plan Note (Addendum)
Chest tightness suspect is due to mild asthma flare and post viral cough No additional steroids at this time. Add Delsym, Tessalon and Pepcid (possible GERD relations) Check D-dimer, if high will proceed with a CT-a chest Plan  Patient Instructions  Advair 1 puff twice daily, rinse after use Albuterol as needed Activity as tolerated Fluids and rest Add Pepcid 20mg  daily for 2 weeks  Labs today .  Delsym 2 tsp Twice daily  As needed  Cough  Tessalon Three times a day  As needed  Cough  Follow-up in 3 months with Dr. Patsey Rivas  and As needed   Please contact office for sooner follow up if symptoms do not improve or worsen or seek emergency care

## 2019-04-22 NOTE — Progress Notes (Signed)
@Patient  ID: Kimberly Rivas, female    DOB: January 23, 1966, 55 y.o.   MRN: [id number]  Chief Complaint  Patient presents with  . Follow-up    COVID 19     Referring provider: Casilda Carls, MD  HPI: 54 year old female never smoker followed for severe persistent asthma, lung nodule on CT chest considered stable   TEST/EVENTS :  Sleep study WNL ONO wnl PFT ratio 84% WNL, Fev1 84% FVC 79% 6MWT wnl  Abs eos 04/13/15; 400  COVID-19 test April 06, 2019 positive   04/22/2019 Follow up : Severe persistent asthma, COVID-19 Patient presents for a follow-up from recent emergency room visit on April 08, 2019.  Patient had URI symptoms.  Also husband was tested positive for COVID-19.  She was found to have positive COVID-19 testing.  He was treated with azithromycin, prednisone taper.  And Zofran .  Patient says she is feeling some better.  She started developing symptoms on December 14 but did not test positive for COVID-19 until December 30.  She had multiple symptoms including cough congestion abdominal issues with nausea and diarrhea.  Fever body aches and general malaise. Chest x-ray showed no acute process.  Troponin was negative. Patient is very anxious and concerned that she is got some long-term damage from COVID-19.  She is also concerned about a blood clot.  She says she does feel some chest tightness residually.  And some left lower calf pain intermittently.  She has no leg swelling.  No hemoptysis O2 saturations today are 96% on arrival.  On room air.  She did not lose her taste or smell.   Allergies  Allergen Reactions  . Hydrocodone Nausea Only  . Tramadol Nausea And Vomiting    Other reaction(s): Vomiting    Immunization History  Administered Date(s) Administered  . Influenza-Unspecified 01/20/2019    Past Medical History:  Diagnosis Date  . Anemia    H/O  . Anxiety   . Asthma   . Atypical chest pain   . Dysrhythmia   . GERD (gastroesophageal reflux disease)    . HLD (hyperlipidemia)   . Hypertension   . Hypothyroid    a. dx'd at age 6  . Obese   . Palpitations    a. 48 hr Holter 02/2015: NSR, rare PACs and PVCs. Otherwise, no significant arrhythmia   . Shortness of breath dyspnea    RELATED TO ANXIETY PER PT-PT STATES SHE CAN WALK A MILE WITHOUT GETTING SOB (05-09-15)PT IS SEEING DR Mortimer Fries FOR A PULMONARY NODULE SEEN ON CT    Tobacco History: Social History   Tobacco Use  Smoking Status Never Smoker  Smokeless Tobacco Never Used   Counseling given: Not Answered   Outpatient Medications Prior to Visit  Medication Sig Dispense Refill  . albuterol (PROAIR HFA) 108 (90 Base) MCG/ACT inhaler Inhale 1-2 puffs into the lungs every 6 (six) hours as needed for wheezing or shortness of breath. 1 Inhaler 5  . aspirin EC 81 MG tablet Take 81 mg by mouth daily.    . cyclobenzaprine (FLEXERIL) 10 MG tablet Take 1 tablet (10 mg total) by mouth 3 (three) times daily as needed for muscle spasms. 30 tablet 0  . fluticasone-salmeterol (ADVAIR HFA) 115-21 MCG/ACT inhaler Inhale 2 puffs into the lungs 2 (two) times daily. Rinse mouth after use. 1 Inhaler 12  . ibuprofen (ADVIL,MOTRIN) 600 MG tablet Take 1 tablet (600 mg total) by mouth every 8 (eight) hours as needed. 30 tablet 0  . levothyroxine (  SYNTHROID, LEVOTHROID) 137 MCG tablet Take 137 mcg by mouth daily before breakfast.    . propranolol (INDERAL) 10 MG tablet Take 20 mg by mouth 3 (three) times daily.    Marland Kitchen azithromycin (ZITHROMAX) 250 MG tablet Take 1 tablet (250 mg total) by mouth daily. Take 2 tabs at once on the first day, then once daily. (Patient not taking: Reported on 04/22/2019) 6 tablet 0  . esomeprazole (NEXIUM) 20 MG capsule Take 1 capsule (20 mg total) by mouth daily. 30 capsule 0  . ondansetron (ZOFRAN ODT) 4 MG disintegrating tablet Take 1 tablet (4 mg total) by mouth every 8 (eight) hours as needed. (Patient not taking: Reported on 04/22/2019) 20 tablet 0  . predniSONE (STERAPRED UNI-PAK  21 TAB) 10 MG (21) TBPK tablet Take as directed. (Patient not taking: Reported on 04/22/2019) 21 tablet 0  . rosuvastatin (CRESTOR) 20 MG tablet Take 1 tablet (20 mg total) by mouth daily. 30 tablet 3   No facility-administered medications prior to visit.     Review of Systems:   Constitutional:   No  weight loss, night sweats,  Fevers, chills, + fatigue, or  lassitude.  HEENT:   No headaches,  Difficulty swallowing,  Tooth/dental problems, or  Sore throat,                No sneezing, itching, ear ache, nasal congestion, post nasal drip,   CV:  No chest pain,  Orthopnea, PND, swelling in lower extremities, anasarca, dizziness, palpitations, syncope.   GI  No heartburn, indigestion, abdominal pain, nausea, vomiting, diarrhea, change in bowel habits, loss of appetite, bloody stools.   Resp:   No chest wall deformity  Skin: no rash or lesions.  GU: no dysuria, change in color of urine, no urgency or frequency.  No flank pain, no hematuria   MS:  No joint pain or swelling.  No decreased range of motion.  No back pain.    Physical Exam  BP 126/82 (BP Location: Left Arm, Patient Position: Sitting, Cuff Size: Large)   Pulse 91   Temp (!) 97.5 F (36.4 C) (Temporal)   Ht 5\' 3"  (1.6 m)   Wt 206 lb 9.6 oz (93.7 kg)   SpO2 96% Comment: on ra  BMI 36.60 kg/m   GEN: A/Ox3; pleasant , NAD, well nourished    HEENT:  /AT,  , NOSE-clear, THROAT-clear, no lesions, no postnasal drip or exudate noted.   NECK:  Supple w/ fair ROM; no JVD; normal carotid impulses w/o bruits; no thyromegaly or nodules palpated; no lymphadenopathy.    RESP  Clear  P & A; w/o, wheezes/ rales/ or rhonchi. no accessory muscle use, no dullness to percussion  CARD:  RRR, no m/r/g, no peripheral edema, pulses intact, no cyanosis or clubbing. Negative Homans' sign  GI:   Soft & nt; nml bowel sounds; no organomegaly or masses detected.   Musco: Warm bil, no deformities or joint swelling noted.   Neuro:  alert, no focal deficits noted.    Skin: Warm, no lesions or rashes    Lab Results:  CBC    Component Value Date/Time   WBC 7.0 04/07/2019 2300   RBC 4.50 04/07/2019 2300   HGB 14.5 04/07/2019 2300   HGB 13.9 04/13/2015 1430   HCT 39.9 04/07/2019 2300   HCT 40.7 04/13/2015 1430   PLT 280 04/07/2019 2300   PLT 321 04/13/2015 1430   MCV 88.7 04/07/2019 2300   MCV 94 04/13/2015 1430   MCV  93 08/24/2013 1734   MCH 32.2 04/07/2019 2300   MCHC 36.3 (H) 04/07/2019 2300   RDW 10.7 (L) 04/07/2019 2300   RDW 11.5 (L) 04/13/2015 1430   RDW 11.7 08/24/2013 1734   LYMPHSABS 2.8 04/07/2019 2300   LYMPHSABS 2.2 04/13/2015 1430   LYMPHSABS 2.4 12/29/2011 1548   MONOABS 0.5 04/07/2019 2300   MONOABS 0.6 12/29/2011 1548   EOSABS 0.2 04/07/2019 2300   EOSABS 0.4 04/13/2015 1430   EOSABS 0.2 12/29/2011 1548   BASOSABS 0.0 04/07/2019 2300   BASOSABS 0.1 04/13/2015 1430   BASOSABS 0.1 12/29/2011 1548    BMET    Component Value Date/Time   NA 139 04/07/2019 2300   NA 143 04/13/2015 1430   NA 138 08/24/2013 1734   K 4.0 04/07/2019 2300   K 3.6 08/24/2013 1734   CL 105 04/07/2019 2300   CL 103 08/24/2013 1734   CO2 25 04/07/2019 2300   CO2 27 08/24/2013 1734   GLUCOSE 115 (H) 04/07/2019 2300   GLUCOSE 93 08/24/2013 1734   BUN 14 04/07/2019 2300   BUN 21 04/13/2015 1430   BUN 17 08/24/2013 1734   CREATININE 0.71 04/07/2019 2300   CREATININE 0.85 08/24/2013 1734   CALCIUM 9.0 04/07/2019 2300   CALCIUM 9.3 08/24/2013 1734   GFRNONAA >60 04/07/2019 2300   GFRNONAA >60 08/24/2013 1734   GFRAA >60 04/07/2019 2300   GFRAA >60 08/24/2013 1734    BNP No results found for: BNP  ProBNP No results found for: PROBNP  Imaging: DG Chest 2 View  Result Date: 04/08/2019 CLINICAL DATA:  Chest pain EXAM: CHEST - 2 VIEW COMPARISON:  12/14/2017 FINDINGS: Chronic interstitial opacities at the bases. No acute consolidation or effusion. Normal heart size. No pneumothorax. IMPRESSION: No  active cardiopulmonary disease. Similar appearance of basilar scarring. Electronically Signed   By: Donavan Foil M.D.   On: 04/08/2019 03:31   CT Head Wo Contrast  Result Date: 04/08/2019 CLINICAL DATA:  Dizziness. Altered mental status today. EXAM: CT HEAD WITHOUT CONTRAST TECHNIQUE: Contiguous axial images were obtained from the base of the skull through the vertex without intravenous contrast. COMPARISON:  None. FINDINGS: Brain: No intracranial hemorrhage, mass effect, or midline shift. No hydrocephalus. The basilar cisterns are patent. No evidence of territorial infarct or acute ischemia. No extra-axial or intracranial fluid collection. Vascular: No hyperdense vessel or unexpected calcification. Skull: No fracture or focal lesion. Sinuses/Orbits: Paranasal sinuses and mastoid air cells are clear. The visualized orbits are unremarkable. Other: None. IMPRESSION: Unremarkable noncontrast head CT. Electronically Signed   By: Keith Rake M.D.   On: 04/08/2019 05:56      PFT Results Latest Ref Rng & Units 08/14/2015  FVC-Pre L 2.70  FVC-Predicted Pre % 79  FVC-Post L 2.65  FVC-Predicted Post % 78  Pre FEV1/FVC % % 84  Post FEV1/FCV % % 87  FEV1-Pre L 2.27  FEV1-Predicted Pre % 84  FEV1-Post L 2.31  DLCO UNC% % 85  DLCO COR %Predicted % 98  TLC L 4.66  TLC % Predicted % 95  RV % Predicted % 103    No results found for: NITRICOXIDE      Assessment & Plan:   COVID-19 virus infection Symptom onset March 21, 2019, positive COVID-19 test on April 06, 2019.  Patient is slowly recovering.  Have encouraged her on supportive care with good nutrition, fluids and rest.  Advance activities as tolerated.  Appears to be recovering.  Recommendations for supportive care.  Plan  Chest tightness Chest tightness suspect is due to mild asthma flare and post viral cough No additional steroids at this time. Add Delsym, Tessalon and Pepcid (possible GERD relations) Check D-dimer, if high  will proceed with a CT-a chest Plan  Patient Instructions  Advair 1 puff twice daily, rinse after use Albuterol as needed Activity as tolerated Fluids and rest Add Pepcid 20mg  daily for 2 weeks  Labs today .  Delsym 2 tsp Twice daily  As needed  Cough  Tessalon Three times a day  As needed  Cough  Follow-up in 3 months with Dr. Patsey Berthold  and As needed   Please contact office for sooner follow up if symptoms do not improve or worsen or seek emergency care       Cough Suspected post viral cough.  Chest x-ray without associated pneumonia.  Patient is to add in cough suppressants with Delsym and Tessalon  Plan  Patient Instructions  Advair 1 puff twice daily, rinse after use Albuterol as needed Activity as tolerated Fluids and rest Add Pepcid 20mg  daily for 2 weeks  Labs today .  Delsym 2 tsp Twice daily  As needed  Cough  Tessalon Three times a day  As needed  Cough  Follow-up in 3 months with Dr. Patsey Berthold  and As needed   Please contact office for sooner follow up if symptoms do not improve or worsen or seek emergency care        Total patient care time35 min   Jsaon Yoo, NP 04/22/2019

## 2019-04-22 NOTE — Patient Instructions (Addendum)
Advair 1 puff twice daily, rinse after use Albuterol as needed Activity as tolerated Fluids and rest Add Pepcid 20mg  daily for 2 weeks  Labs today .  Delsym 2 tsp Twice daily  As needed  Cough  Tessalon Three times a day  As needed  Cough  Follow-up in 3 months with Dr. Patsey Berthold  and As needed   Please contact office for sooner follow up if symptoms do not improve or worsen or seek emergency care

## 2019-04-22 NOTE — Assessment & Plan Note (Signed)
Symptom onset March 21, 2019, positive COVID-19 test on April 06, 2019.  Patient is slowly recovering.  Have encouraged her on supportive care with good nutrition, fluids and rest.  Advance activities as tolerated.  Appears to be recovering.  Recommendations for supportive care.  Plan

## 2019-07-07 ENCOUNTER — Other Ambulatory Visit: Payer: Self-pay

## 2019-07-07 ENCOUNTER — Ambulatory Visit: Payer: BC Managed Care – PPO | Attending: Internal Medicine

## 2019-07-07 DIAGNOSIS — Z23 Encounter for immunization: Secondary | ICD-10-CM

## 2019-07-07 NOTE — Progress Notes (Signed)
   Covid-19 Vaccination Clinic  Name:  Kimberly Rivas    MRN: [id number] DOB: 31-Jan-1966  07/07/2019  Kimberly Rivas was observed post Covid-19 immunization for 30 minutes based on pre-vaccination screening without incident. She was provided with Vaccine Information Sheet and instruction to access the V-Safe system.   Kimberly Rivas was instructed to call 911 with any severe reactions post vaccine: Marland Kitchen Difficulty breathing  . Swelling of face and throat  . A fast heartbeat  . A bad rash all over body  . Dizziness and weakness   Immunizations Administered    Name Date Dose VIS Date Route   Pfizer COVID-19 Vaccine 07/07/2019  9:28 AM 0.3 mL 03/18/2019 Intramuscular   Manufacturer: [place]   Lot: [id number]   [place]: [id number]

## 2019-07-07 NOTE — Progress Notes (Addendum)
   Covid-19 Vaccination Clinic  Name:  Kimberly Rivas    MRN: [id number] DOB: 04-22-1965  07/07/2019  Kimberly Rivas was observed post Covid-19 immunization for 30 minutes based on pre-vaccination screening .  During the observation period, she experienced an adverse reaction with the following symptoms:  dizziness.  Assessment : Time of assessment 0915. Alert and oriented and Anxious.Complains of feeling light headed.  Actions taken:  Vitals sign taken  VAERS form completed BP- 154/96, P- 61, SPO2- 98%, water and snack given   Medications administered: No medication administered.  Disposition: Reports no further symptoms of adverse reaction after observation for 30 minutes. Discharged home. The Patient was provided with Vaccine Information Sheet and instruction to access the V-Safe system.    Immunizations Administered    Name Date Dose VIS Date Route   Pfizer COVID-19 Vaccine 07/07/2019  9:28 AM 0.3 mL 03/18/2019 Intramuscular   Manufacturer: [place]   Lot: [id number]   [place]: [id number]

## 2019-07-20 ENCOUNTER — Telehealth: Payer: Self-pay | Admitting: Cardiovascular Disease

## 2019-07-20 NOTE — Telephone Encounter (Signed)
Started out as bruise and now is a "knot" in the area. She got the injection about 2 weeks ago. It is swollen and painful.  Advised patient to contact PCP which she has already done and is waiting for a call back. If they cannot get her in today, then advised Urgent Care and she was appreciative.

## 2019-07-20 NOTE — Telephone Encounter (Signed)
Patient calling for asap appt to look at injection site from covid vaccine in arm.  Declined tomorrow with Geni Bers, wanted same day .    Patient c/o hardness at site that is now moved up arm and extreme redness at site .  Patient concerned about clot.  Denies acute sob but states she has asthma.    Patient will also call pcp office to discuss as well and attempt to be seen same day.

## 2019-08-02 ENCOUNTER — Ambulatory Visit: Payer: BC Managed Care – PPO | Attending: Internal Medicine

## 2019-08-02 ENCOUNTER — Telehealth: Payer: Self-pay | Admitting: *Deleted

## 2019-08-02 DIAGNOSIS — Z23 Encounter for immunization: Secondary | ICD-10-CM

## 2019-08-02 NOTE — Progress Notes (Addendum)
   Covid-19 Vaccination Clinic  Name:  Kimberly Rivas    MRN: [id number] DOB: [birth year]  08/02/2019  [name] was observed post Covid-19 immunization for 15 minutes .  During the observation period, she experienced an adverse reaction with the following symptoms:  Reports itching to the upper chest..  Assessment : Time of assessment 0945. Urticaria. No visible signs of rash or hives noted, complained of itching to upper chest.  Actions taken:  Vitals sign taken  VAERS form obtained and completed by RN.  VS at 0945- BP- 140/101, P- 65, 99%, recheck at 0959- BP- 139/83, P- 61, O2- 99%, 1015- BP- 125/88, P- 63, O2- 98%, water given, reports itching has stopped at 0959.  Medications administered: Loratadine (Claritin) 10 mg  by mouth. Time: [id number]. Administered by RN. Famotidine (Pepcid) 20 mg by mouth. Time: [id number]. Administered by RN.  Disposition: Reports no further symptoms of adverse reaction after observation for 45 minutes. Discharged home. Instructed to call 911 for trouble breathing, rapid heart rate, dizziness, swelling of tongue or throat.   Immunizations Administered    Name Date Dose VIS Date Route   Pfizer COVID-19 Vaccine 08/02/2019  9:29 AM 0.3 mL 06/01/2018 Intramuscular   Manufacturer: [place]   Lot: [id number]   [place]: [id number]

## 2019-08-02 NOTE — Progress Notes (Signed)
   Covid-19 Vaccination Clinic  Name:  Kimberly Rivas    MRN: [id number] DOB: [birth year]  08/02/2019  Ms. Kimberly Rivas was observed post Covid-19 immunization for 15 minutes without incident. She was provided with Vaccine Information Sheet and instruction to access the V-Safe system.   Ms. Kimberly Rivas was instructed to call 911 with any severe reactions post vaccine: Marland Kitchen Difficulty breathing  . Swelling of face and throat  . A fast heartbeat  . A bad rash all over body  . Dizziness and weakness   Immunizations Administered    Name Date Dose VIS Date Route   Pfizer COVID-19 Vaccine 08/02/2019  9:29 AM 0.3 mL 06/01/2018 Intramuscular   Manufacturer: [place]   Lot: [id number]   [place]: [id number]

## 2019-08-02 NOTE — Telephone Encounter (Signed)
Called and spoke with patient in regards to her second [place] vaccine that she received today. She stated that she had some itching on her chest but denies any rash, hives, difficulty breathing, or angioedema. She states that she is fatigued at the moment but not having any other symptoms. Advised to patient that if she does have any worsening symptoms to please feel free to give me a call. Advised to patient that if she gets any future COVID boosters to please call our office in advance and we can give her some recommendations prior. Patient verbalized understanding.

## 2020-02-13 ENCOUNTER — Other Ambulatory Visit: Payer: Self-pay

## 2020-02-13 MED ORDER — FLUTICASONE-SALMETEROL 115-21 MCG/ACT IN AERO: 2.0000 | INHALATION_SPRAY | Freq: Two times a day (BID) | RESPIRATORY_TRACT | 1 refills | Status: DC

## 2020-02-13 NOTE — Telephone Encounter (Signed)
Received refill request for Advair from [address].

## 2020-02-14 ENCOUNTER — Other Ambulatory Visit: Payer: Self-pay | Admitting: Internal Medicine

## 2020-02-14 DIAGNOSIS — Z1231 Encounter for screening mammogram for malignant neoplasm of breast: Secondary | ICD-10-CM

## 2020-04-03 ENCOUNTER — Telehealth: Payer: Self-pay | Admitting: Adult Health

## 2020-04-03 MED ORDER — ALBUTEROL SULFATE HFA 108 (90 BASE) MCG/ACT IN AERS: 1.0000 | INHALATION_SPRAY | Freq: Four times a day (QID) | RESPIRATORY_TRACT | 5 refills | Status: DC | PRN

## 2020-04-03 NOTE — Telephone Encounter (Signed)
Refill sent in for the proair.  Nothing further is needed.

## 2020-04-12 ENCOUNTER — Other Ambulatory Visit: Payer: Self-pay

## 2020-04-12 ENCOUNTER — Ambulatory Visit
Admission: RE | Admit: 2020-04-12 | Discharge: 2020-04-12 | Disposition: A | Discharge status: Home / Self Care | Payer: BC Managed Care – PPO | Source: Ambulatory Visit | Attending: Internal Medicine | Admitting: Internal Medicine

## 2020-04-12 DIAGNOSIS — Z1231 Encounter for screening mammogram for malignant neoplasm of breast: Secondary | ICD-10-CM | POA: Insufficient documentation

## 2020-05-30 ENCOUNTER — Other Ambulatory Visit: Payer: Self-pay | Admitting: Pulmonary Disease

## 2020-06-05 ENCOUNTER — Other Ambulatory Visit
Admission: RE | Admit: 2020-06-05 | Discharge: 2020-06-05 | Disposition: A | Discharge status: Home / Self Care | Payer: BC Managed Care – PPO | Source: Ambulatory Visit | Attending: Pulmonary Disease | Admitting: Pulmonary Disease

## 2020-06-05 ENCOUNTER — Ambulatory Visit
Admission: RE | Admit: 2020-06-05 | Discharge: 2020-06-05 | Disposition: A | Discharge status: Home / Self Care | Payer: BC Managed Care – PPO | Source: Ambulatory Visit | Attending: Pulmonary Disease | Admitting: Pulmonary Disease

## 2020-06-05 ENCOUNTER — Ambulatory Visit: Payer: BC Managed Care – PPO | Admitting: Pulmonary Disease

## 2020-06-05 ENCOUNTER — Other Ambulatory Visit: Payer: Self-pay

## 2020-06-05 ENCOUNTER — Encounter: Payer: Self-pay | Admitting: Pulmonary Disease

## 2020-06-05 VITALS — BP 134/76 | HR 71 | Temp 97.1°F | Ht 63.0 in | Wt 211.0 lb

## 2020-06-05 DIAGNOSIS — R0602 Shortness of breath: Secondary | ICD-10-CM

## 2020-06-05 DIAGNOSIS — R059 Cough, unspecified: Secondary | ICD-10-CM

## 2020-06-05 DIAGNOSIS — J4540 Moderate persistent asthma, uncomplicated: Secondary | ICD-10-CM | POA: Diagnosis not present

## 2020-06-05 DIAGNOSIS — Z8616 Personal history of COVID-19: Secondary | ICD-10-CM

## 2020-06-05 LAB — CBC WITH DIFFERENTIAL/PLATELET
Abs Immature Granulocytes: 0.05 10*3/uL (ref 0.00–0.07)
Basophils Absolute: 0.1 10*3/uL (ref 0.0–0.1)
Basophils Relative: 1 %
Eosinophils Absolute: 0.2 10*3/uL (ref 0.0–0.5)
Eosinophils Relative: 2 %
HCT: 43.8 % (ref 36.0–46.0)
Hemoglobin: 15.5 g/dL — ABNORMAL HIGH (ref 12.0–15.0)
Immature Granulocytes: 1 %
Lymphocytes Relative: 18 %
Lymphs Abs: 1.9 10*3/uL (ref 0.7–4.0)
MCH: 33.3 pg (ref 26.0–34.0)
MCHC: 35.4 g/dL (ref 30.0–36.0)
MCV: 94 fL (ref 80.0–100.0)
Monocytes Absolute: 0.8 10*3/uL (ref 0.1–1.0)
Monocytes Relative: 7 %
Neutro Abs: 7.6 10*3/uL (ref 1.7–7.7)
Neutrophils Relative %: 71 %
Platelets: 290 10*3/uL (ref 150–400)
RBC: 4.66 MIL/uL (ref 3.87–5.11)
RDW: 10.9 % — ABNORMAL LOW (ref 11.5–15.5)
WBC: 10.6 10*3/uL — ABNORMAL HIGH (ref 4.0–10.5)
nRBC: 0 % (ref 0.0–0.2)

## 2020-06-05 MED ORDER — TRELEGY ELLIPTA 200-62.5-25 MCG/INH IN AEPB: 1.0000 | INHALATION_SPRAY | Freq: Every day | RESPIRATORY_TRACT | 0 refills | Status: DC

## 2020-06-05 NOTE — Progress Notes (Signed)
Subjective:    Patient ID: Kimberly Rivas, female    DOB: 1965/10/31, 55 y.o.   MRN: [id number]  HPI Patient is a 55 year old lifelong never smoker who presents for follow-up on shortness of breath and cough.  She carries a diagnosis of persistent asthma severity unclassified.  Previously followed with Dr. Ashby Dawes.  Most recently seen by her nurse practitioner Rexene Edison, NP.  This is her first visit with me.  As noted she has been a never smoker however she has significant secondhand smoke exposure during her childhood years.  No history of asthma as a child.  She had COVID-19 in December 2020 and again it is believed that she had in January 2021 however did not get tested at that time.  All of the members of her household however had it.  She has noted increased deeply congested cough productive of yellowish sputum at times.  No hemoptysis.  She notices shortness of breath with exertion.  She also notices significant bronchospasm.  No orthopnea or paroxysmal nocturnal dyspnea.  Lower extremity edema.  No chest pain.  She does not endorse any gastroesophageal reflux symptoms.  She does notice fatigue.  Of note she has been on Inderal for a number of years for control of her hypertension and anxiety.   TEST/EVENTS :  Sleep study WNL ONO wnl PFT ratio 84% WNL, Fev1 84% FVC 79% 6MWT wnl  Abs eos 04/13/15; 400  CT chest 08/05/2017: Stable right upper lobe nodule/scar no other abnormalities  COVID-19 test April 06, 2019 positive  Review of Systems A 10 point review of systems was performed and it is as noted above otherwise negative.  Patient Active Problem List   Diagnosis Date Noted  . COVID-19 virus infection 04/22/2019  . Cough 04/22/2019  . Hypothyroid   . Chest tightness   . Obese   . Chest pressure 02/16/2015  . Palpitations 02/16/2015   Family History  Problem Relation Age of Onset  . Acute myelogenous leukemia Mother   . COPD Mother   . Heart Problems  Mother   . Hypertension Mother   . Hypothyroidism Mother   . Stroke Mother   . Stroke Father   . Epilepsy Brother   . Cancer Paternal Aunt   . Heart Problems Paternal Grandmother   . Heart Problems Paternal Grandfather   . Breast cancer Neg Hx    Allergies  Allergen Reactions  . Hydrocodone Nausea Only  . Tramadol Nausea And Vomiting    Other reaction(s): Vomiting   Current Meds  Medication Sig  . ADVAIR HFA 115-21 MCG/ACT inhaler INHALE 2 PUFFS BY MOUTH TWICE DAILY RINSE MOUTH AFTER USE  . albuterol (PROAIR HFA) 108 (90 Base) MCG/ACT inhaler Inhale 1-2 puffs into the lungs every 6 (six) hours as needed for wheezing or shortness of breath.  Marland Kitchen aspirin EC 81 MG tablet Take 81 mg by mouth daily.  . benzonatate (TESSALON) 200 MG capsule Take 1 capsule (200 mg total) by mouth 3 (three) times daily as needed for cough.  . cyclobenzaprine (FLEXERIL) 10 MG tablet Take 1 tablet (10 mg total) by mouth 3 (three) times daily as needed for muscle spasms.  . hydrochlorothiazide (HYDRODIURIL) 25 MG tablet Take 25 mg by mouth daily.  Marland Kitchen ibuprofen (ADVIL,MOTRIN) 600 MG tablet Take 1 tablet (600 mg total) by mouth every 8 (eight) hours as needed.  Marland Kitchen levothyroxine (SYNTHROID, LEVOTHROID) 137 MCG tablet Take 137 mcg by mouth daily before breakfast.  . propranolol (INDERAL) 10 MG  tablet Take 20 mg by mouth 3 (three) times daily.   Immunization History  Administered Date(s) Administered  . Influenza-Unspecified 01/20/2019  . PFIZER(Purple Top)SARS-COV-2 Vaccination 07/07/2019, 08/02/2019      Objective:   Physical Exam BP 134/76 (BP Location: Left Arm, Cuff Size: Normal)   Pulse 71   Temp (!) 97.1 F (36.2 C) (Temporal)   Ht 5\' 3"  (1.6 m)   Wt 211 lb (95.7 kg)   SpO2 98%   BMI 37.38 kg/m  GENERAL: Obese woman, no acute distress, no conversational dyspnea.  Occasional deep congested sounding cough. HEAD: Normocephalic, atraumatic.  EYES: Pupils equal, round, reactive to light.  No scleral  icterus.  Mild periorbital puffiness (allergic shiners). MOUTH: Nose/mouth/throat not examined due to masking requirements for COVID 19. NECK: Supple. No thyromegaly. Trachea midline. No JVD.  No adenopathy. PULMONARY: Good air entry bilaterally.  Coarse otherwise, no adventitious sounds. CARDIOVASCULAR: S1 and S2. Regular rate and rhythm.  No rubs, murmurs or gallops heard. ABDOMEN: Bees otherwise benign. MUSCULOSKELETAL: No joint deformity, no clubbing, no edema.  NEUROLOGIC: No focal deficit, speech is fluent, no gait disturbance. SKIN: Intact,warm,dry.  On limited exam no rashes. PSYCH: Mood and behavior is normal.     Assessment & Plan:     ICD-10-CM   1. Moderate persistent asthma, unspecified whether complicated  E95.28 Pulmonary Function Test [facility] Only    Pulmonary Function Test [facility] Only   PFTs, alpha-1 Trial of Trelegy Ellipta 200/62.5/25 1 inhalation daily Stop Advair Allergen panel CBC with differential  2. SOB (shortness of breath)  R06.02 DG Chest 2 View    Allergen Panel (27) + IGE    Alpha-1 antitrypsin phenotype    Pulmonary Function Test [facility] Only    CBC w/Diff    CANCELED: CBC w/Diff   PFTs to clarify May be related to obesity  3. Cough  R05.9    Trial of Trelegy as above  4. Personal history of COVID-19  Z86.16    This issue adds complexity to her management   Orders Placed This Encounter  Procedures  . DG Chest 2 View    Standing Status:   Future    Number of Occurrences:   1    Standing Expiration Date:   12/06/2020    Order Specific Question:   Reason for Exam (SYMPTOM  OR DIAGNOSIS REQUIRED)    Answer:   cough    Order Specific Question:   Preferred imaging location?    Answer:   [facility]    Order Specific Question:   Is the patient pregnant?    Answer:   No  . Allergen Panel (27) + IGE    Standing Status:   Future    Number of Occurrences:   1    Standing Expiration Date:   06/05/2021  . Alpha-1 antitrypsin phenotype    Standing Status:    Future    Number of Occurrences:   1    Standing Expiration Date:   06/05/2021  . CBC w/Diff    Standing Status:   Future    Number of Occurrences:   1    Standing Expiration Date:   06/05/2021  . Pulmonary Function Test [facility] Only    Scheduling Instructions:     4 weeks    Order Specific Question:   Full PFT: includes the following: basic spirometry, spirometry pre & post bronchodilator, diffusion capacity (DLCO), lung volumes    Answer:   Full PFT  . Pulmonary Function Test  [facility] Only    Standing Status:   Future    Standing Expiration Date:   06/05/2021    Scheduling Instructions:     4 weeks    Order Specific Question:   Full PFT: includes the following: basic spirometry, spirometry pre & post bronchodilator, diffusion capacity (DLCO), lung volumes    Answer:   Full PFT   Meds ordered this encounter  Medications  . Fluticasone-Umeclidin-Vilant (TRELEGY ELLIPTA) 200-62.5-25 MCG/INH AEPB    Sig: Inhale 1 puff into the lungs daily.    Dispense:  30 each    Refill:  0    Order Specific Question:   Lot Number?    Answer:   [id number]    Order Specific Question:   Expiration Date?    Answer:   04/07/2021    Order Specific Question:   Manufacturer?    Answer:   [employer] [[id number]]    Order Specific Question:   Quantity    Answer:   2   Discussion:  Patient has persistent asthma severity to be determined. Will obtain PFTs as well as allergen panel. There is a history of respiratory illnesses in the family will check for alpha-1 as well. Have optimized her inhaler medication as above.  Of note, she is on a nonspecific beta-blocker and would recommend to try to at least switch her propranolol to a more selective beta-blocker (such as Bystolic) given that propranolol is not an ideal beta-blocker for asthmatics. I will defer this to her primary care physician to determine what medication should suit her best.  We will see her in follow-up in 4 to 6 weeks time she is to call sooner should any new  problems arise.  Renold Don, MD [facility]   *This note was dictated using voice recognition software/Dragon.  Despite best efforts to proofread, errors can occur which can change the meaning.  Any change was purely unintentional.

## 2020-06-05 NOTE — Patient Instructions (Addendum)
We are giving you a trial of Trelegy Ellipta 200/6.25/25, this is 1 inhalation daily.  Make sure you rinse your mouth well after you use it.  Let us know if this works for you.  You have enough samples to last you for a month.  DO NOT USE ADVAIR WHILE USING THE TRELEGY.  I recommend that your beta-blocker (propranolol) be changed for a more selective beta-blocker.  Propranolol is not ideal in patients with asthma.  Or selective beta-blocker would be Bystolic.  This is usually well tolerated by patients with asthma.  We are going to get breathing tests, a chest x-ray and some blood test to help me determine what other factors are affecting your asthma.  We will see you in follow-up in 4 to 6 weeks time call sooner should any new problems arise.

## 2020-06-07 ENCOUNTER — Telehealth: Payer: Self-pay | Admitting: Pulmonary Disease

## 2020-06-07 LAB — ALPHA-1 ANTITRYPSIN PHENOTYPE: A-1 Antitrypsin, Ser: 148 mg/dL (ref 101–187)

## 2020-06-07 NOTE — Telephone Encounter (Signed)
Patient is aware of date/time of covid test prior to PFT.  

## 2020-06-08 LAB — ALLERGEN PANEL (27) + IGE
Alternaria Alternata IgE: <0.1 kU/L
Aspergillus Fumigatus IgE: <0.1 kU/L
Bahia Grass IgE: <0.1 kU/L
Bermuda Grass IgE: <0.1 kU/L
Cat Dander IgE: <0.1 kU/L
Cedar, Mountain IgE: <0.1 kU/L
Cladosporium Herbarum IgE: <0.1 kU/L
Cocklebur IgE: <0.1 kU/L
Cockroach, American IgE: <0.1 kU/L
Common Silver Birch IgE: <0.1 kU/L
D Farinae IgE: <0.1 kU/L
D Pteronyssinus IgE: <0.1 kU/L
Dog Dander IgE: <0.1 kU/L
Elm, American IgE: <0.1 kU/L
Hickory, White IgE: <0.1 kU/L
IgE (Immunoglobulin E), Serum: 12 IU/mL (ref 6–495)
Johnson Grass IgE: <0.1 kU/L
Kentucky Bluegrass IgE: <0.1 kU/L
Maple/Box Elder IgE: <0.1 kU/L
Mucor Racemosus IgE: <0.1 kU/L
Oak, White IgE: <0.1 kU/L
Penicillium Chrysogen IgE: <0.1 kU/L
Pigweed, Rough IgE: <0.1 kU/L
Plantain, English IgE: <0.1 kU/L
Ragweed, Short IgE: <0.1 kU/L
Setomelanomma Rostrat: <0.1 kU/L
Timothy Grass IgE: <0.1 kU/L
White Mulberry IgE: <0.1 kU/L

## 2020-06-11 ENCOUNTER — Other Ambulatory Visit
Admission: RE | Admit: 2020-06-11 | Discharge: 2020-06-11 | Disposition: A | Discharge status: Home / Self Care | Payer: BC Managed Care – PPO | Source: Ambulatory Visit | Attending: Pulmonary Disease | Admitting: Pulmonary Disease

## 2020-06-11 ENCOUNTER — Other Ambulatory Visit: Payer: Self-pay

## 2020-06-11 DIAGNOSIS — Z01812 Encounter for preprocedural laboratory examination: Secondary | ICD-10-CM | POA: Diagnosis present

## 2020-06-11 DIAGNOSIS — Z20822 Contact with and (suspected) exposure to covid-19: Secondary | ICD-10-CM | POA: Insufficient documentation

## 2020-06-11 LAB — SARS CORONAVIRUS 2 (TAT 6-24 HRS): SARS Coronavirus 2: NEGATIVE

## 2020-06-12 ENCOUNTER — Ambulatory Visit: Payer: BC Managed Care – PPO | Attending: Pulmonary Disease

## 2020-06-12 DIAGNOSIS — J4540 Moderate persistent asthma, uncomplicated: Secondary | ICD-10-CM | POA: Diagnosis not present

## 2020-06-12 DIAGNOSIS — Z79899 Other long term (current) drug therapy: Secondary | ICD-10-CM | POA: Diagnosis not present

## 2020-06-12 LAB — PULMONARY FUNCTION TEST ARMC ONLY
DL/VA % pred: 89 %
DL/VA: 3.82 ml/min/mmHg/L
DLCO unc % pred: 86 %
DLCO unc: 17.19 ml/min/mmHg
FEF 25-75 Post: 2.41 L/sec
FEF 25-75 Pre: 2.89 L/sec
FEF2575-%Change-Post: -16 %
FEF2575-%Pred-Post: 98 %
FEF2575-%Pred-Pre: 117 %
FEV1-%Change-Post: -3 %
FEV1-%Pred-Post: 83 %
FEV1-%Pred-Pre: 86 %
FEV1-Post: 2.14 L
FEV1-Pre: 2.22 L
FEV1FVC-%Change-Post: -3 %
FEV1FVC-%Pred-Pre: 111 %
FEV6-%Change-Post: 0 %
FEV6-%Pred-Post: 79 %
FEV6-%Pred-Pre: 79 %
FEV6-Post: 2.54 L
FEV6-Pre: 2.52 L
FEV6FVC-%Pred-Post: 103 %
FEV6FVC-%Pred-Pre: 103 %
FVC-%Change-Post: 0 %
FVC-%Pred-Post: 77 %
FVC-%Pred-Pre: 77 %
FVC-Post: 2.54 L
Post FEV1/FVC ratio: 84 %
Post FEV6/FVC ratio: 100 %
Pre FEV1/FVC ratio: 88 %
Pre FEV6/FVC Ratio: 100 %
RV % pred: 83 %
RV: 1.55 L
TLC % pred: 92 %
TLC: 4.56 L

## 2020-06-21 ENCOUNTER — Telehealth: Payer: Self-pay | Admitting: Adult Health

## 2020-06-21 NOTE — Telephone Encounter (Signed)
See lab result note.

## 2020-06-21 NOTE — Telephone Encounter (Signed)
Called and spoke to patient, who is requesting PFT results from 06/12/2020.  Dr. Patsey Berthold, please advise. thanks

## 2020-06-21 NOTE — Telephone Encounter (Signed)
Tyler Pita, MD  Claudette Head A, CMA Her asthma seems well controlled by the PFTs. She does not take a very deep breath likely related to increased weight. Weight loss would be recommended. This should help greatly with her shortness of breath. Overall the test look good.    Patient is aware of results and voiced her understanding.  Nothing further needed at this time.

## 2020-06-25 ENCOUNTER — Telehealth: Payer: Self-pay | Admitting: Pulmonary Disease

## 2020-06-25 MED ORDER — TRELEGY ELLIPTA 200-62.5-25 MCG/INH IN AEPB: 1.0000 | INHALATION_SPRAY | Freq: Every day | RESPIRATORY_TRACT | 5 refills | Status: DC

## 2020-06-25 NOTE — Telephone Encounter (Signed)
Rx for Trelegy 200mg  has been sent to preferred pharmacy, as patient feels that this medication is effective.  Nothing further needed at this time.

## 2020-08-07 ENCOUNTER — Ambulatory Visit: Payer: BC Managed Care – PPO | Admitting: Pulmonary Disease

## 2020-08-07 ENCOUNTER — Other Ambulatory Visit: Payer: Self-pay

## 2020-08-07 ENCOUNTER — Encounter: Payer: Self-pay | Admitting: Pulmonary Disease

## 2020-08-07 VITALS — BP 130/80 | HR 82 | Temp 97.8°F | Ht 63.0 in | Wt 210.2 lb

## 2020-08-07 DIAGNOSIS — R059 Cough, unspecified: Secondary | ICD-10-CM

## 2020-08-07 DIAGNOSIS — K219 Gastro-esophageal reflux disease without esophagitis: Secondary | ICD-10-CM

## 2020-08-07 DIAGNOSIS — R0602 Shortness of breath: Secondary | ICD-10-CM | POA: Diagnosis not present

## 2020-08-07 DIAGNOSIS — J4540 Moderate persistent asthma, uncomplicated: Secondary | ICD-10-CM

## 2020-08-07 DIAGNOSIS — Z8616 Personal history of COVID-19: Secondary | ICD-10-CM

## 2020-08-07 DIAGNOSIS — E669 Obesity, unspecified: Secondary | ICD-10-CM

## 2020-08-07 MED ORDER — PANTOPRAZOLE SODIUM 40 MG PO TBEC: 40.0000 mg | DELAYED_RELEASE_TABLET | Freq: Every day | ORAL | 2 refills | Status: DC

## 2020-08-07 NOTE — Progress Notes (Signed)
Subjective:    Patient ID: Kimberly Rivas, female    DOB: Mar 17, 1966, 55 y.o.   MRN: [id number]\  Chief Complaint  Patient presents with  . Follow-up    Asthma -  at night feels short winded sometimes.   HPI Kimberly Rivas is a 55 year old lifelong never smoker who presents for follow-up on the issue of shortness of breath and cough.  She was last seen here on 05 June 2020, this is a scheduled visit.  She carries a diagnosis of.  Since her prior visit she has had her Inderal decreased and is being tapered off of it.  She is on losartan for blood pressure control.  He has been having issues with management of her thyroid but this is being addressed by endocrinology.  She notes improvement of symptoms with Trelegy Ellipta but still gets shortness of breath at nighttime which requires albuterol use.  She has not had any fevers, chills or sweats.  Overall she feels that she is improving.  Significant gastroesophageal reflux symptoms.   Review of Systems  A 10 point review of systems was performed and it is as noted above otherwise negative.  Patient Active Problem List   Diagnosis Date Noted  . COVID-19 virus infection 04/22/2019  . Cough 04/22/2019  . Hypothyroid   . Chest tightness   . Obese   . Chest pressure 02/16/2015  . Palpitations 02/16/2015   Social History   Tobacco Use  . Smoking status: Never Smoker  . Smokeless tobacco: Never Used  Substance Use Topics  . Alcohol use: Not Currently   Allergies  Allergen Reactions  . Hydrocodone Nausea Only  . Tramadol Nausea And Vomiting    Other reaction(s): Vomiting   Current Meds  Medication Sig  . albuterol (PROAIR HFA) 108 (90 Base) MCG/ACT inhaler Inhale 1-2 puffs into the lungs every 6 (six) hours as needed for wheezing or shortness of breath.  Marland Kitchen aspirin EC 81 MG tablet Take 81 mg by mouth daily.  . benzonatate (TESSALON) 200 MG capsule Take 1 capsule (200 mg total) by mouth 3 (three) times daily as needed for cough.  .  cyclobenzaprine (FLEXERIL) 10 MG tablet Take 1 tablet (10 mg total) by mouth 3 (three) times daily as needed for muscle spasms.  . Fluticasone-Umeclidin-Vilant (TRELEGY ELLIPTA) 200-62.5-25 MCG/INH AEPB Inhale 1 puff into the lungs daily.  . hydrochlorothiazide (HYDRODIURIL) 25 MG tablet Take 25 mg by mouth daily.  Marland Kitchen ibuprofen (ADVIL,MOTRIN) 600 MG tablet Take 1 tablet (600 mg total) by mouth every 8 (eight) hours as needed.  Marland Kitchen levothyroxine (SYNTHROID, LEVOTHROID) 137 MCG tablet Take 137 mcg by mouth daily before breakfast.  . propranolol (INDERAL) 10 MG tablet Take 20 mg by mouth 3 (three) times daily.   Immunization History  Administered Date(s) Administered  . Influenza-Unspecified 01/20/2019  . PFIZER(Purple Top)SARS-COV-2 Vaccination 07/07/2019, 08/02/2019      Objective:   Physical Exam BP 130/80 (BP Location: Left Arm, Patient Position: Sitting, Cuff Size: Normal)   Pulse 82   Temp 97.8 F (36.6 C) (Temporal)   Ht 5\' 3"  (1.6 m)   Wt 210 lb 3.2 oz (95.3 kg)   SpO2 96%   BMI 37.24 kg/m   GENERAL: Obese woman, no acute distress, no conversational dyspnea. HEAD: Normocephalic, atraumatic.  EYES: Pupils equal, round, reactive to light.  No scleral icterus.  Mild periorbital puffiness (allergic shiners). MOUTH: Nose/mouth/throat not examined due to masking requirements for COVID 19. NECK: Supple. No thyromegaly. Trachea midline. No JVD.  No adenopathy. PULMONARY: Good air entry bilaterally.  Coarse otherwise, no adventitious sounds. CARDIOVASCULAR: S1 and S2. Regular rate and rhythm.  No rubs, murmurs or gallops heard. ABDOMEN: Bees otherwise benign. MUSCULOSKELETAL: No joint deformity, no clubbing, no edema.  NEUROLOGIC: No focal deficit, speech is fluent, no gait disturbance. SKIN: Intact,warm,dry.  On limited exam no rashes. PSYCH: Mood and behavior is normal.      Assessment & Plan:     ICD-10-CM   1. Moderate persistent asthma without complication  C62.37     Continue Trelegy Ellipta  2. SOB (shortness of breath)  R06.02 ECHOCARDIOGRAM COMPLETE   Obtain 2D echo Evaluate for potential pulmonary hypertension  3. Cough  R05.9    Improved with Trelegy GERD/LPR may be playing a part  4. Laryngopharyngeal reflux (LPR)  K21.9    Trial of pantoprazole 40 mg daily Antireflux measures  5. Personal history of COVID-19  Z86.16    This issue adds complexity to her management  6. Obesity, Class II, BMI 35-39.9, isolated (see actual BMI)  E66.9    Weight loss is recommended    Orders Placed This Encounter  Procedures  . ECHOCARDIOGRAM COMPLETE    Standing Status:   Future    Standing Expiration Date:   02/07/2021    Order Specific Question:   Where should this test be performed    Answer:   [facility]    Order Specific Question:   Perflutren DEFINITY (image enhancing agent) should be administered unless hypersensitivity or allergy exist    Answer:   Administer Perflutren    Order Specific Question:   Reason for exam-Echo    Answer:   Pulmonary hypertension I27.2    Order Specific Question:   Reason for exam-Echo    Answer:   Dyspnea  R06.00   Meds ordered this encounter  Medications  . pantoprazole (PROTONIX) 40 MG tablet    Sig: Take 1 tablet (40 mg total) by mouth daily.    Dispense:  30 tablet    Refill:  2   We will see the patient in follow-up in 6 to 8 weeks time.  We will call her with the results of the echocardiogram.  C. Derrill Kay, MD [facility]   *This note was dictated using voice recognition software/Dragon.  Despite best efforts to proofread, errors can occur which can change the meaning.  Any change was purely unintentional.

## 2020-08-07 NOTE — Patient Instructions (Signed)
We will get an echocardiogram (heart test) to evaluate the pressure of the artery going from your heart to your lungs.  Sometimes this can cause issues with shortness of breath.  We are also going to give you medication for reflux, you can continue taking Pepcid AC at bedtime.

## 2020-08-17 ENCOUNTER — Encounter: Payer: Self-pay | Admitting: Pulmonary Disease

## 2020-08-23 ENCOUNTER — Other Ambulatory Visit: Payer: Self-pay

## 2020-08-23 ENCOUNTER — Ambulatory Visit (INDEPENDENT_AMBULATORY_CARE_PROVIDER_SITE_OTHER): Payer: BC Managed Care – PPO

## 2020-08-23 DIAGNOSIS — R0602 Shortness of breath: Secondary | ICD-10-CM

## 2020-08-23 LAB — ECHOCARDIOGRAM COMPLETE
AR max vel: 1.7 cm2
AV Area VTI: 1.74 cm2
AV Area mean vel: 1.61 cm2
AV Mean grad: 4 mmHg
AV Peak grad: 8.1 mmHg
Ao pk vel: 1.42 m/s
Area-P 1/2: 3.02 cm2
S' Lateral: 2.9 cm

## 2020-08-23 MED ORDER — PERFLUTREN LIPID MICROSPHERE
1.0000 mL | INTRAVENOUS | Status: AC | PRN
  Administered 2020-08-23: 2 mL via INTRAVENOUS

## 2020-08-24 ENCOUNTER — Telehealth: Payer: Self-pay | Admitting: Pulmonary Disease

## 2020-08-24 ENCOUNTER — Telehealth: Payer: Self-pay | Admitting: Cardiovascular Disease

## 2020-08-24 NOTE — Telephone Encounter (Signed)
Patient calling to discuss concerning results of recent echo.     Patient aware results are pending an md note and nursing will call when results available to discuss either today or Monday per routine.

## 2020-08-24 NOTE — Telephone Encounter (Signed)
Tyler Pita, MD  Claudette Head A, CMA Echocardiogram was actually very good. Does have mild stiffness of the so she will need to maintain good blood pressure control. This can be managed by her primary care physician.    Patient is aware of results and voiced her understanding. Nothing further needed.

## 2020-08-24 NOTE — Telephone Encounter (Signed)
Echo was ordered by pulmonology. Patient  has not been seen by Cardiology since 2018. Adv the patient to contact Dr. Domingo Dimes office for results. Patient verbalized understanding.

## 2020-08-24 NOTE — Telephone Encounter (Signed)
Spoke to patient, who is requesting results of echo.  She also wanted to make Dr. Patsey Berthold aware that she has lost 10lbs since last ov. She now weighs 200lbs.  Dr. Patsey Berthold, please advise. thanks

## 2020-08-24 NOTE — Telephone Encounter (Signed)
See results on the echo

## 2020-08-28 ENCOUNTER — Ambulatory Visit: Payer: BC Managed Care – PPO

## 2020-09-12 ENCOUNTER — Other Ambulatory Visit: Payer: BC Managed Care – PPO

## 2020-12-26 ENCOUNTER — Other Ambulatory Visit: Payer: Self-pay | Admitting: Internal Medicine

## 2020-12-26 DIAGNOSIS — R1030 Lower abdominal pain, unspecified: Secondary | ICD-10-CM

## 2021-01-01 ENCOUNTER — Other Ambulatory Visit: Payer: Self-pay | Admitting: Pulmonary Disease

## 2021-01-07 ENCOUNTER — Ambulatory Visit
Admission: RE | Admit: 2021-01-07 | Discharge: 2021-01-07 | Disposition: A | Discharge status: Home / Self Care | Payer: BC Managed Care – PPO | Source: Ambulatory Visit | Attending: Internal Medicine | Admitting: Internal Medicine

## 2021-01-07 ENCOUNTER — Other Ambulatory Visit: Payer: Self-pay

## 2021-01-07 DIAGNOSIS — R1030 Lower abdominal pain, unspecified: Secondary | ICD-10-CM | POA: Insufficient documentation

## 2021-01-11 ENCOUNTER — Encounter: Payer: Self-pay | Admitting: Obstetrics and Gynecology

## 2021-01-11 ENCOUNTER — Other Ambulatory Visit: Payer: Self-pay

## 2021-01-11 ENCOUNTER — Ambulatory Visit (INDEPENDENT_AMBULATORY_CARE_PROVIDER_SITE_OTHER): Payer: BC Managed Care – PPO | Admitting: Obstetrics and Gynecology

## 2021-01-11 ENCOUNTER — Other Ambulatory Visit (HOSPITAL_COMMUNITY)
Admission: RE | Admit: 2021-01-11 | Discharge: 2021-01-11 | Disposition: A | Discharge status: Home / Self Care | Payer: BC Managed Care – PPO | Source: Ambulatory Visit | Attending: Obstetrics and Gynecology | Admitting: Obstetrics and Gynecology

## 2021-01-11 VITALS — BP 124/80 | Ht 63.0 in | Wt 207.0 lb

## 2021-01-11 DIAGNOSIS — R935 Abnormal findings on diagnostic imaging of other abdominal regions, including retroperitoneum: Secondary | ICD-10-CM | POA: Diagnosis not present

## 2021-01-11 NOTE — Progress Notes (Signed)
Gynecology Ultrasound Follow Up  Chief Complaint:  Chief Complaint  Patient presents with   Referral    Abnormal u/s results     History of Present Illness: Patient is a 55 y.o. female who presents today for ultrasound evaluation of incidental endometrial thickening  Ultrasound demonstrates the following findgins Adnexa: no masses seen  Uterus: Non-enlarged with endometrial stripe  53mm Additional: no free fluid  Review of Systems: Review of Systems  Constitutional: Negative.   Gastrointestinal:  Positive for abdominal pain. Negative for constipation, diarrhea, nausea and vomiting.  Genitourinary: Negative.    Past Medical History:  Past Medical History:  Diagnosis Date   Anemia    H/O   Anxiety    Asthma    Atypical chest pain    Dysrhythmia    GERD (gastroesophageal reflux disease)    HLD (hyperlipidemia)    Hypertension    Hypothyroid    a. dx'd at age 44   Obese    Palpitations    a. 48 hr Holter 02/2015: NSR, rare PACs and PVCs. Otherwise, no significant arrhythmia    Shortness of breath dyspnea    RELATED TO ANXIETY PER PT-PT STATES SHE CAN WALK A MILE WITHOUT GETTING SOB (05-09-15)PT IS SEEING DR Mortimer Fries FOR A PULMONARY NODULE SEEN ON CT    Past Surgical History:  Past Surgical History:  Procedure Laterality Date   BREAST REDUCTION SURGERY     CESAREAN SECTION     X3   COLONOSCOPY     KNEE ARTHROSCOPY Left 05/10/2015   Procedure: ARTHROSCOPY KNEE, partial medial meniscectomy;  Surgeon: Hessie Knows, MD;  Location: [facility] ORS;  Service: Orthopedics;  Laterality: Left;   REDUCTION MAMMAPLASTY Bilateral 1998    Gynecologic History:  No LMP recorded. Patient is postmenopausal. Contraception: post menopausal status  Family History:  Family History  Problem Relation Age of Onset   Acute myelogenous leukemia Mother    COPD Mother    Heart Problems Mother    Hypertension Mother    Hypothyroidism Mother    Stroke Mother    Stroke Father    Epilepsy  Brother    Ovarian cancer Paternal Aunt        100s   Heart Problems Paternal Grandmother    Heart Problems Paternal Grandfather    Breast cancer Neg Hx     Social History:  Social History   Socioeconomic History   Marital status: Married    Spouse name: Not on file   Number of children: Not on file   Years of education: Not on file   Highest education level: Not on file  Occupational History   Not on file  Tobacco Use   Smoking status: Never   Smokeless tobacco: Never  Vaping Use   Vaping Use: Never used  Substance and Sexual Activity   Alcohol use: Not Currently   Drug use: No   Sexual activity: Yes    Birth control/protection: Post-menopausal  Other Topics Concern   Not on file  Social History Narrative   Not on file   Social Determinants of Health   Financial Resource Strain: Not on file  Food Insecurity: Not on file  Transportation Needs: Not on file  Physical Activity: Not on file  Stress: Not on file  Social Connections: Not on file  Intimate Partner Violence: Not on file    Allergies:  Allergies  Allergen Reactions   Hydrocodone Nausea Only   Tramadol Nausea And Vomiting    Other reaction(s):  Vomiting    Medications: Prior to Admission medications   Medication Sig Start Date End Date Taking? Authorizing Provider  albuterol (PROAIR HFA) 108 (90 Base) MCG/ACT inhaler Inhale 1-2 puffs into the lungs every 6 (six) hours as needed for wheezing or shortness of breath. 04/03/20  Yes Parrett, Tammy S, NP  aspirin EC 81 MG tablet Take 81 mg by mouth daily.   Yes [provider]  azithromycin (ZITHROMAX) 250 MG tablet Take by mouth. 01/09/21  Yes [provider]  cetirizine (ZYRTEC) 10 MG tablet Take 10 mg by mouth daily as needed. 01/09/21  Yes [provider]  cyclobenzaprine (FLEXERIL) 10 MG tablet Take 1 tablet (10 mg total) by mouth 3 (three) times daily as needed for muscle spasms. 12/14/17  Yes Darel Hong, MD   hydrochlorothiazide (HYDRODIURIL) 25 MG tablet Take 25 mg by mouth daily. 05/10/20  Yes [provider]  ibuprofen (ADVIL,MOTRIN) 600 MG tablet Take 1 tablet (600 mg total) by mouth every 8 (eight) hours as needed. 12/14/17  Yes Darel Hong, MD  levothyroxine (SYNTHROID, LEVOTHROID) 137 MCG tablet Take 137 mcg by mouth daily before breakfast.   Yes [provider]  liothyronine (CYTOMEL) 5 MCG tablet Take 5 mcg by mouth daily. 12/21/20  Yes [provider]  losartan (COZAAR) 50 MG tablet Take 50 mg by mouth daily. 12/17/20  Yes [provider]  propranolol (INDERAL) 10 MG tablet Take 20 mg by mouth 3 (three) times daily.   Yes [provider]  rosuvastatin (CRESTOR) 10 MG tablet Take 10 mg by mouth daily. 01/01/21  Yes [provider]  sertraline (ZOLOFT) 25 MG tablet Take 25 mg by mouth daily. 12/21/20  Yes [provider]  Donnal Debar 200-62.5-25 MCG/INH AEPB INHALE 1 PUFF ONCE DAILY 01/01/21  Yes Tyler Pita, MD  ALPRAZolam Duanne Moron) 0.25 MG tablet SMARTSIG:1 Tablet(s) By Mouth Patient not taking: Reported on 01/11/2021 08/15/20   [provider]  pantoprazole (PROTONIX) 40 MG tablet Take 1 tablet (40 mg total) by mouth daily. 08/07/20 11/05/20  Tyler Pita, MD    Physical Exam Vitals: Blood pressure 124/80, height 5\' 3"  (1.6 m), weight 207 lb (93.9 kg).  General: NAD HEENT: normocephalic, anicteric Pulmonary: No increased work of breathing Extremities: no edema, erythema, or tenderness Neurologic: Grossly intact, normal gait Psychiatric: mood appropriate, affect full   ENDOMETRIAL BIOPSY     The indications for endometrial biopsy were reviewed.   Risks of the biopsy including cramping, bleeding, infection, uterine perforation, inadequate specimen and need for additional procedures  were discussed. The patient states she understands and agrees to undergo procedure today. Consent was signed. Time out was  performed. Urine HCG was negative. A Graves speculum was placed and the cervix was brought into view.  The cervix was prepped with Betadine. A single-toothed tenaculum was placed on the anterior lip of the cervix for traction. A 3 mm pipelle was introduced through the cervix into the endometrial cavity without difficulty to a depth of 5cm, and a small amount of tissue was obtained in two passes, the resulting specime sent to pathology. The instruments were removed from the patient's vagina. Minimal bleeding from the cervix was noted. The patient tolerated the procedure well. Routine post-procedure instructions were given to the patient.  She will be contacted by phone one results become available.     Malachy Mood, MD, [facility], [facility]  CPT [phone]   Assessment: 55 y.o. [phone] No problem-specific Assessment &  Plan notes found for this encounter.   Plan: Problem List Items Addressed This Visit   None Visit Diagnoses     Abnormal endometrial ultrasound    -  Primary   Relevant Orders   Surgical pathology       1) Endometrial thickening - An endometrial measurement greater than 4 mm that is incidentally discovered in a postmenopausal patient without bleeding need not routinely trigger evaluation, although an individualized assessment based on patient characteristics and risk factors is appropriate. Thus, transvaginal ultrasonography is not an appropriate screening tool for endometrial cancer in postmenopausal women without bleeding.    ACOG Committee Opinion 734, May 2018 "The Role of Transvaginal Ultrasonography in Evaluating the Endometrium of Women with Postmenopausal Bleeding"   - we discussed that no further work up was indicated.  However we did discuss that we could obtain endometrial sampling in today which the patient elected to proceed with.  If reassuring findings on biopsy no further work up.  If not diagnostic biopsy can consider follow up ultrasound  to assess stability of endometrium.    Malachy Mood, MD, [facility], [facility] 01/11/2021, 10:00 AM

## 2021-01-15 LAB — SURGICAL PATHOLOGY

## 2021-01-17 ENCOUNTER — Other Ambulatory Visit: Payer: Self-pay | Admitting: Obstetrics and Gynecology

## 2021-01-17 NOTE — Progress Notes (Signed)
Hysteroscopy D&C scheduled given non-diagnostic in office biopsy

## 2021-01-21 ENCOUNTER — Telehealth: Payer: Self-pay

## 2021-01-21 NOTE — Telephone Encounter (Signed)
-----   Message from Malachy Mood, MD sent at 01/17/2021  5:10 PM EDT ----- Regarding: Surgery Surgery Booking Request Patient Full Name:  RAYHANA SLIDER  MRN: [id number]  DOB: [birth year]  Surgeon: Malachy Mood, MD  Requested Surgery Date and Time: Postmenopausal bleeding Primary Diagnosis AND Code: Abnormal endometrial ultrasound Secondary Diagnosis and Code:  Surgical Procedure: Hysteroscopy D&C RNFA Requested?: No L&D Notification: No Admission Status: same day surgery Length of Surgery: 50 min Special Case Needs: No H&P: Yes Phone Interview???:  No Interpreter: PCP has recent eval for chest pain and shortness of breath Medical Clearance:  No Special Scheduling Instructions: No Any known health/anesthesia issues, diabetes, sleep apnea, latex allergy, defibrillator/pacemaker?: No Acuity: P2   (P1 highest, P2 delay may cause harm, P3 low, elective gyn, P4 lowest) Post op follow up visits: 1 week and 2 weeks

## 2021-01-21 NOTE — Telephone Encounter (Signed)
Called patient to schedule Hysteroscopy D&C w Georgianne Fick  DOS 10/27  H&P 10/26 @ 2:10 in [place] phone call appointment to be requested - date and time will be included on H&P paper work. Also all appointments will be updated on pt MyChart. Explained that this appointment has a call window. Based on the time scheduled will indicate if the call will be received within a 4 hour window before 1:00 or after.  Advised that pt may also receive calls from the hospital pharmacy and pre-service center.  Confirmed pt has [employer] as Chartered certified accountant. No secondary insurance.

## 2021-01-28 ENCOUNTER — Encounter
Admission: RE | Admit: 2021-01-28 | Discharge: 2021-01-28 | Disposition: A | Discharge status: Home / Self Care | Payer: BC Managed Care – PPO | Source: Ambulatory Visit | Attending: Obstetrics and Gynecology | Admitting: Obstetrics and Gynecology

## 2021-01-28 ENCOUNTER — Other Ambulatory Visit: Payer: Self-pay

## 2021-01-28 ENCOUNTER — Other Ambulatory Visit: Payer: Self-pay | Admitting: Obstetrics and Gynecology

## 2021-01-28 VITALS — Ht 63.0 in | Wt 202.0 lb

## 2021-01-28 DIAGNOSIS — R002 Palpitations: Secondary | ICD-10-CM

## 2021-01-28 DIAGNOSIS — E669 Obesity, unspecified: Secondary | ICD-10-CM

## 2021-01-28 DIAGNOSIS — R0981 Nasal congestion: Secondary | ICD-10-CM

## 2021-01-28 DIAGNOSIS — Z01812 Encounter for preprocedural laboratory examination: Secondary | ICD-10-CM

## 2021-01-28 DIAGNOSIS — Z79899 Other long term (current) drug therapy: Secondary | ICD-10-CM

## 2021-01-28 DIAGNOSIS — I10 Essential (primary) hypertension: Secondary | ICD-10-CM

## 2021-01-28 NOTE — Patient Instructions (Signed)
Your procedure is scheduled on: 01/31/21 Report to [facility]. To find out your arrival time please call [phone] between 1PM - 3PM on 01/30/21.  Remember: Instructions that are not followed completely may result in serious medical risk, up to and including death, or upon the discretion of your surgeon and anesthesiologist your surgery may need to be rescheduled.     _X__ 1. Do not eat food or drink any liquids after midnight the night before your procedure.                 No gum chewing or hard candies.   __X__2.  On the morning of surgery brush your teeth with toothpaste and water, you                 may rinse your mouth with mouthwash if you wish.  Do not swallow any              toothpaste of mouthwash.     _X__ 3.  No Alcohol for 24 hours before or after surgery.   _X__ 4.  Do Not Smoke or use e-cigarettes For 24 Hours Prior to Your Surgery.                 Do not use any chewable tobacco products for at least 6 hours prior to                 surgery.  ____  5.  Bring all medications with you on the day of surgery if instructed.   __X__  6.  Notify your doctor if there is any change in your medical condition      (cold, fever, infections).     Do not wear jewelry, make-up, hairpins, clips or nail polish. Do not wear lotions, powders, or perfumes.  Do not shave body hair 48 hours prior to surgery. Men may shave face and neck. Do not bring valuables to the hospital.    [facility] is not responsible for any belongings or valuables.  Contacts, dentures/partials or body piercings may not be worn into surgery. Bring a case for your contacts, glasses or hearing aids, a denture cup will be supplied. Leave your suitcase in the car. After surgery it may be brought to your room. For patients admitted to the hospital, discharge time is determined by your treatment team.   Patients discharged the day of surgery will not be  allowed to drive home.   Please read over the following fact sheets that you were given:     __X__ Take these medicines the morning of surgery with A SIP OF WATER:    1. cetirizine (ZYRTEC) 10 MG tablet  2. famotidine (PEPCID) 20 MG tablet  3. levothyroxine (SYNTHROID, LEVOTHROID) 137 MCG tablet  4. liothyronine (CYTOMEL) 5 MCG tablet  5. propranolol (INDERAL) 20 MG tablet  6. rosuvastatin (CRESTOR) 10 MG tablet  7. sertraline (ZOLOFT) 25 MG tablet  ____ Fleet Enema (as directed)   ____ Use CHG Soap/SAGE wipes as directed  __X__ Use inhalers on the day of surgery  TRELEGY ELLIPTA AND ALBUTEROL  ____ Stop metformin/Janumet/Farxiga 2 days prior to surgery    ____ Take 1/2 of usual insulin dose the night before surgery. No insulin the morning          of surgery.   ____ Stop Blood Thinners Coumadin/Plavix/Xarelto/Pleta/Pradaxa/Eliquis/Effient/Aspirin  on   Or contact your Surgeon, Cardiologist or Medical Doctor regarding  ability to stop your blood thinners  __X__ Stop Anti-inflammatories 7 days before surgery such as Advil, Ibuprofen, Motrin,  BC or Goodies Powder, Naprosyn, Naproxen, Aleve, Aspirin    __X__ Stop all herbal supplements, fish oil or vitamin E until after surgery.    ____ Bring C-Pap to the hospital.

## 2021-01-28 NOTE — Telephone Encounter (Signed)
Pt calling; has procedure schedule in hosp c [facility] Thursday; pt has a cold; was told to let [facility] know.  [phone]

## 2021-01-29 ENCOUNTER — Other Ambulatory Visit
Admission: RE | Admit: 2021-01-29 | Discharge: 2021-01-29 | Disposition: A | Discharge status: Home / Self Care | Payer: BC Managed Care – PPO | Source: Ambulatory Visit | Attending: Obstetrics and Gynecology | Admitting: Obstetrics and Gynecology

## 2021-01-29 ENCOUNTER — Other Ambulatory Visit: Payer: Self-pay | Admitting: Pulmonary Disease

## 2021-01-29 DIAGNOSIS — R051 Acute cough: Secondary | ICD-10-CM

## 2021-01-29 DIAGNOSIS — Z885 Allergy status to narcotic agent status: Secondary | ICD-10-CM | POA: Diagnosis not present

## 2021-01-29 DIAGNOSIS — Z01812 Encounter for preprocedural laboratory examination: Secondary | ICD-10-CM

## 2021-01-29 DIAGNOSIS — I10 Essential (primary) hypertension: Secondary | ICD-10-CM | POA: Insufficient documentation

## 2021-01-29 DIAGNOSIS — Z8616 Personal history of COVID-19: Secondary | ICD-10-CM | POA: Diagnosis not present

## 2021-01-29 DIAGNOSIS — Z6835 Body mass index (BMI) 35.0-35.9, adult: Secondary | ICD-10-CM

## 2021-01-29 DIAGNOSIS — R059 Cough, unspecified: Secondary | ICD-10-CM | POA: Diagnosis not present

## 2021-01-29 DIAGNOSIS — Z01818 Encounter for other preprocedural examination: Secondary | ICD-10-CM | POA: Insufficient documentation

## 2021-01-29 DIAGNOSIS — Z79899 Other long term (current) drug therapy: Secondary | ICD-10-CM

## 2021-01-29 DIAGNOSIS — E039 Hypothyroidism, unspecified: Secondary | ICD-10-CM | POA: Diagnosis not present

## 2021-01-29 DIAGNOSIS — Z79890 Hormone replacement therapy (postmenopausal): Secondary | ICD-10-CM | POA: Diagnosis not present

## 2021-01-29 DIAGNOSIS — K219 Gastro-esophageal reflux disease without esophagitis: Secondary | ICD-10-CM | POA: Diagnosis not present

## 2021-01-29 DIAGNOSIS — Z7982 Long term (current) use of aspirin: Secondary | ICD-10-CM | POA: Diagnosis not present

## 2021-01-29 DIAGNOSIS — R002 Palpitations: Secondary | ICD-10-CM | POA: Insufficient documentation

## 2021-01-29 DIAGNOSIS — Z20822 Contact with and (suspected) exposure to covid-19: Secondary | ICD-10-CM | POA: Diagnosis not present

## 2021-01-29 DIAGNOSIS — E669 Obesity, unspecified: Secondary | ICD-10-CM | POA: Insufficient documentation

## 2021-01-29 DIAGNOSIS — Z888 Allergy status to other drugs, medicaments and biological substances status: Secondary | ICD-10-CM | POA: Diagnosis not present

## 2021-01-29 DIAGNOSIS — Z6836 Body mass index (BMI) 36.0-36.9, adult: Secondary | ICD-10-CM | POA: Diagnosis not present

## 2021-01-29 DIAGNOSIS — Z7951 Long term (current) use of inhaled steroids: Secondary | ICD-10-CM | POA: Diagnosis not present

## 2021-01-29 DIAGNOSIS — N840 Polyp of corpus uteri: Secondary | ICD-10-CM | POA: Diagnosis present

## 2021-01-29 DIAGNOSIS — R9389 Abnormal findings on diagnostic imaging of other specified body structures: Secondary | ICD-10-CM | POA: Diagnosis not present

## 2021-01-29 LAB — BASIC METABOLIC PANEL
Anion gap: 9 (ref 5–15)
BUN: 14 mg/dL (ref 6–20)
CO2: 26 mmol/L (ref 22–32)
Calcium: 9.2 mg/dL (ref 8.9–10.3)
Chloride: 98 mmol/L (ref 98–111)
Creatinine, Ser: 0.77 mg/dL (ref 0.44–1.00)
GFR, Estimated: >60 mL/min (ref >60)
Glucose, Bld: 117 mg/dL — ABNORMAL HIGH (ref 70–99)
Potassium: 3.3 mmol/L — ABNORMAL LOW (ref 3.5–5.1)
Sodium: 133 mmol/L — ABNORMAL LOW (ref 135–145)

## 2021-01-29 LAB — TYPE AND SCREEN
ABO/RH(D): O POS
Antibody Screen: NEGATIVE

## 2021-01-30 ENCOUNTER — Other Ambulatory Visit: Payer: Self-pay

## 2021-01-30 ENCOUNTER — Ambulatory Visit (INDEPENDENT_AMBULATORY_CARE_PROVIDER_SITE_OTHER): Payer: BC Managed Care – PPO | Admitting: Obstetrics and Gynecology

## 2021-01-30 ENCOUNTER — Encounter: Payer: Self-pay | Admitting: Urgent Care

## 2021-01-30 ENCOUNTER — Encounter: Payer: Self-pay | Admitting: Obstetrics and Gynecology

## 2021-01-30 ENCOUNTER — Other Ambulatory Visit
Admission: RE | Admit: 2021-01-30 | Discharge: 2021-01-30 | Disposition: A | Discharge status: Home / Self Care | Payer: BC Managed Care – PPO | Source: Ambulatory Visit | Attending: Obstetrics and Gynecology | Admitting: Obstetrics and Gynecology

## 2021-01-30 VITALS — BP 109/68 | Ht 63.0 in | Wt 207.0 lb

## 2021-01-30 DIAGNOSIS — Z01818 Encounter for other preprocedural examination: Secondary | ICD-10-CM | POA: Insufficient documentation

## 2021-01-30 DIAGNOSIS — Z01812 Encounter for preprocedural laboratory examination: Secondary | ICD-10-CM

## 2021-01-30 DIAGNOSIS — Z20822 Contact with and (suspected) exposure to covid-19: Secondary | ICD-10-CM | POA: Insufficient documentation

## 2021-01-30 DIAGNOSIS — R059 Cough, unspecified: Secondary | ICD-10-CM

## 2021-01-30 DIAGNOSIS — R051 Acute cough: Secondary | ICD-10-CM

## 2021-01-30 LAB — SARS CORONAVIRUS 2 BY RT PCR (HOSPITAL ORDER, PERFORMED IN ~~LOC~~ HOSPITAL LAB): SARS Coronavirus 2: NEGATIVE

## 2021-01-30 MED ORDER — POVIDONE-IODINE 10 % EX SWAB: 2.0000 "application " | Freq: Once | CUTANEOUS | Status: DC

## 2021-01-30 MED ORDER — ORAL CARE MOUTH RINSE: 15.0000 mL | Freq: Once | OROMUCOSAL | Status: AC

## 2021-01-30 MED ORDER — LACTATED RINGERS IV SOLN: INTRAVENOUS | Status: DC

## 2021-01-30 MED ORDER — CHLORHEXIDINE GLUCONATE 0.12 % MT SOLN: 15.0000 mL | Freq: Once | OROMUCOSAL | Status: AC

## 2021-01-30 NOTE — H&P (View-Only) (Signed)
Obstetrics & Gynecology Surgery H&P    Chief Complaint: Scheduled Surgery   History of Present Illness: Patient is a 55 y.o. [id number] presenting for scheduled hysteroscopy, D&C, for the treatment or further evaluation of incidental endometrial thickening.   Prior Treatments prior to proceeding with surgery include: attempted in office endometrial biopsy  Preoperative Endometrial biopsy: 01/11/2021 Findings: pathology only rare endometrial cells non-diagnostic Preoperative Ultrasound: 01/07/2021 44mm endometrial stripe no focal abnormalities   Review of Systems:10 point review of systems  Past Medical History:  Patient Active Problem List   Diagnosis Date Noted   COVID-19 virus infection 04/22/2019   Cough 04/22/2019   Hypothyroid    Chest tightness    Obese    Chest pressure 02/16/2015   Palpitations 02/16/2015    Past Surgical History:  Past Surgical History:  Procedure Laterality Date   BREAST REDUCTION SURGERY     CESAREAN SECTION     X3   COLONOSCOPY     KNEE ARTHROSCOPY Left 05/10/2015   Procedure: ARTHROSCOPY KNEE, partial medial meniscectomy;  Surgeon: Hessie Knows, MD;  Location: [facility] ORS;  Service: Orthopedics;  Laterality: Left;   REDUCTION MAMMAPLASTY Bilateral 1998    Family History:  Family History  Problem Relation Age of Onset   Acute myelogenous leukemia Mother    COPD Mother    Heart Problems Mother    Hypertension Mother    Hypothyroidism Mother    Stroke Mother    Stroke Father    Epilepsy Brother    Ovarian cancer Paternal Aunt        24s   Heart Problems Paternal Grandmother    Heart Problems Paternal Grandfather    Breast cancer Neg Hx     Social History:  Social History   Socioeconomic History   Marital status: Married    Spouse name: Not on file   Number of children: Not on file   Years of education: Not on file   Highest education level: Not on file  Occupational History   Not on file  Tobacco Use   Smoking status: Never    Smokeless tobacco: Never  Vaping Use   Vaping Use: Never used  Substance and Sexual Activity   Alcohol use: Not Currently   Drug use: No   Sexual activity: Yes    Birth control/protection: Post-menopausal  Other Topics Concern   Not on file  Social History Narrative   Not on file   Social Determinants of Health   Financial Resource Strain: Not on file  Food Insecurity: Not on file  Transportation Needs: Not on file  Physical Activity: Not on file  Stress: Not on file  Social Connections: Not on file  Intimate Partner Violence: Not on file    Allergies:  Allergies  Allergen Reactions   Hydrocodone Nausea Only   Tramadol Nausea And Vomiting    Medications: Prior to Admission medications   Medication Sig Start Date End Date Taking? Authorizing Provider  albuterol (PROAIR HFA) 108 (90 Base) MCG/ACT inhaler Inhale 1-2 puffs into the lungs every 6 (six) hours as needed for wheezing or shortness of breath. 04/03/20   Parrett, Fonnie Mu, NP  ALPRAZolam (XANAX) 0.25 MG tablet Take 0.125-0.25 mg by mouth daily as needed for anxiety. 08/15/20   [provider]  aspirin EC 81 MG tablet Take 81 mg by mouth daily.    [provider]  cetirizine (ZYRTEC) 10 MG tablet Take 10 mg by mouth daily as needed for allergies. 01/09/21  [provider]  cyclobenzaprine (FLEXERIL) 10 MG tablet Take 1 tablet (10 mg total) by mouth 3 (three) times daily as needed for muscle spasms. 12/14/17   Darel Hong, MD  famotidine (PEPCID) 20 MG tablet Take 20 mg by mouth daily as needed for heartburn or indigestion.    [provider]  hydrochlorothiazide (HYDRODIURIL) 25 MG tablet Take 25 mg by mouth daily. 05/10/20   [provider]  ibuprofen (ADVIL,MOTRIN) 600 MG tablet Take 1 tablet (600 mg total) by mouth every 8 (eight) hours as needed. 12/14/17   Darel Hong, MD  levothyroxine (SYNTHROID, LEVOTHROID) 137 MCG tablet Take 137 mcg by mouth daily before  breakfast.    [provider]  liothyronine (CYTOMEL) 5 MCG tablet Take 5 mcg by mouth daily. 12/21/20   [provider]  losartan (COZAAR) 50 MG tablet Take 50 mg by mouth daily. 12/17/20   [provider]  propranolol (INDERAL) 20 MG tablet Take 20 mg by mouth 3 (three) times daily.    [provider]  rosuvastatin (CRESTOR) 10 MG tablet Take 10 mg by mouth daily. 01/01/21   [provider]  sertraline (ZOLOFT) 25 MG tablet Take 25 mg by mouth daily. 12/21/20   [provider]  Donnal Debar 200-62.5-25 MCG/ACT AEPB INHALE 1 PUFF ONCE DAILY 01/29/21   Tyler Pita, MD    Physical Exam Vitals: Blood pressure 109/68, height 5\' 3"  (1.6 m), weight 207 lb (93.9 kg).  General: NAD HEENT: normocephalic, anicteric Pulmonary: No increased work of breathing, CTAB Cardiovascular: RRR, distal pulses 2+ Abdomen: soft, non-tender Genitourinary: deferred  Extremities: no edema, erythema, or tenderness Neurologic: Grossly intact Psychiatric: mood appropriate, affect full  Imaging US PELVIC COMPLETE WITH TRANSVAGINAL  Result Date: 01/07/2021 CLINICAL DATA:  Lower pain EXAM: TRANSABDOMINAL AND TRANSVAGINAL ULTRASOUND OF PELVIS TECHNIQUE: Both transabdominal and transvaginal ultrasound examinations of the pelvis were performed. Transabdominal technique was performed for global imaging of the pelvis including uterus, ovaries, adnexal regions, and pelvic cul-de-sac. It was necessary to proceed with endovaginal exam following the transabdominal exam to visualize the uterus endometrium adnexa. COMPARISON:  None FINDINGS: Uterus Measurements: 8.9 x 3.9 x 4.7 cm = volume: 86 mL. No fibroids or other mass visualized. Endometrium Thickness: 8.1 mm.  Tiny cystic areas. Right ovary Not seen Left ovary Measurements: 2.2 x 1.2 x 1.5 cm = volume: 2 mL. Normal appearance/no adnexal mass. Other findings No abnormal free fluid. IMPRESSION: 1. Endometrial thickness of  8.1 mm. Endometrial thickness is considered abnormal for an asymptomatic post-menopausal female. Endometrial sampling should be considered to exclude carcinoma. 2. Nonvisualized right ovary Electronically Signed   By: Donavan Foil M.D.   On: 01/07/2021 16:29    Assessment: 55 y.o. [id number] presenting for scheduled incidental endometrial thickening, non-diagnostic in office biopsy  Plan: 1) I have discussed with the patient the indications for the procedure. Included in the discussion were the options of therapy, as wall as their individual risks, benefits, and complications. Ample time was given to answer all questions.   In office pipelle biopsy generally provides comparable results to [facility], however this sampling modality may miss focal lesions if these were previously documented on ultrasound.  It is because of the potential to miss focal lesions that hysteroscopy D&C is also warranted in patient with continued postmenopausal bleeding that is not self limited regardless of prior in office biopsy results or ultrasound findings.  She understands that the risk of continued observation include worsening bleeding or worsening of any  underlying pathology.  The choices include: 1. Doing nothing but following her symptoms 2. Attempts at hormonal manipulation with either BCP or Depo-Provera for premenopausal patients with no concern for focal lesion or endometrial pathology 3. D&C/hysteroscopy. 4. Endometrial ablation via Novasure or other techniques for premenopausal patients with no concern for focal lesion or endometrial pathology  5. As final resort, hysterectomy. After consideration of her history and findings, mutual decision has been made to proceed with D+C/hysteroscopy. While the incidence is low, the risks from this surgery include, but are not limited to, the risks of anesthesia, hemorrhage, infection, perforation, and injury to adjacent structures including bowel, bladder and blood vessels.    2) Routine  postoperative instructions were reviewed with the patient and her family in detail today including the expected length of recovery and likely postoperative course.  The patient concurred with the proposed plan, giving informed written consent for the surgery today.  Patient instructed on the importance of being NPO after midnight prior to her procedure.  If warranted preoperative prophylactic antibiotics and SCDs ordered on call to the OR to meet SCIP guidelines and adhere to recommendation laid forth in [place] Number 104 May 2009  "Antibiotic Prophylaxis for Gynecologic Procedures".     Malachy Mood, MD, [facility], [facility] 01/30/2021, 2:06 PM

## 2021-01-30 NOTE — Progress Notes (Signed)
Obstetrics & Gynecology Surgery H&P    Chief Complaint: Scheduled Surgery   History of Present Illness: Patient is a 55 y.o. [id number] presenting for scheduled hysteroscopy, D&C, for the treatment or further evaluation of incidental endometrial thickening.   Prior Treatments prior to proceeding with surgery include: attempted in office endometrial biopsy  Preoperative Endometrial biopsy: 01/11/2021 Findings: pathology only rare endometrial cells non-diagnostic Preoperative Ultrasound: 01/07/2021 69mm endometrial stripe no focal abnormalities   Review of Systems:10 point review of systems  Past Medical History:  Patient Active Problem List   Diagnosis Date Noted   COVID-19 virus infection 04/22/2019   Cough 04/22/2019   Hypothyroid    Chest tightness    Obese    Chest pressure 02/16/2015   Palpitations 02/16/2015    Past Surgical History:  Past Surgical History:  Procedure Laterality Date   BREAST REDUCTION SURGERY     CESAREAN SECTION     X3   COLONOSCOPY     KNEE ARTHROSCOPY Left 05/10/2015   Procedure: ARTHROSCOPY KNEE, partial medial meniscectomy;  Surgeon: Hessie Knows, MD;  Location: [facility] ORS;  Service: Orthopedics;  Laterality: Left;   REDUCTION MAMMAPLASTY Bilateral 1998    Family History:  Family History  Problem Relation Age of Onset   Acute myelogenous leukemia Mother    COPD Mother    Heart Problems Mother    Hypertension Mother    Hypothyroidism Mother    Stroke Mother    Stroke Father    Epilepsy Brother    Ovarian cancer Paternal Aunt        70s   Heart Problems Paternal Grandmother    Heart Problems Paternal Grandfather    Breast cancer Neg Hx     Social History:  Social History   Socioeconomic History   Marital status: Married    Spouse name: Not on file   Number of children: Not on file   Years of education: Not on file   Highest education level: Not on file  Occupational History   Not on file  Tobacco Use   Smoking status: Never    Smokeless tobacco: Never  Vaping Use   Vaping Use: Never used  Substance and Sexual Activity   Alcohol use: Not Currently   Drug use: No   Sexual activity: Yes    Birth control/protection: Post-menopausal  Other Topics Concern   Not on file  Social History Narrative   Not on file   Social Determinants of Health   Financial Resource Strain: Not on file  Food Insecurity: Not on file  Transportation Needs: Not on file  Physical Activity: Not on file  Stress: Not on file  Social Connections: Not on file  Intimate Partner Violence: Not on file    Allergies:  Allergies  Allergen Reactions   Hydrocodone Nausea Only   Tramadol Nausea And Vomiting    Medications: Prior to Admission medications   Medication Sig Start Date End Date Taking? Authorizing Provider  albuterol (PROAIR HFA) 108 (90 Base) MCG/ACT inhaler Inhale 1-2 puffs into the lungs every 6 (six) hours as needed for wheezing or shortness of breath. 04/03/20   Parrett, Fonnie Mu, NP  ALPRAZolam (XANAX) 0.25 MG tablet Take 0.125-0.25 mg by mouth daily as needed for anxiety. 08/15/20   [provider]  aspirin EC 81 MG tablet Take 81 mg by mouth daily.    [provider]  cetirizine (ZYRTEC) 10 MG tablet Take 10 mg by mouth daily as needed for allergies. 01/09/21  [provider]  cyclobenzaprine (FLEXERIL) 10 MG tablet Take 1 tablet (10 mg total) by mouth 3 (three) times daily as needed for muscle spasms. 12/14/17   Darel Hong, MD  famotidine (PEPCID) 20 MG tablet Take 20 mg by mouth daily as needed for heartburn or indigestion.    [provider]  hydrochlorothiazide (HYDRODIURIL) 25 MG tablet Take 25 mg by mouth daily. 05/10/20   [provider]  ibuprofen (ADVIL,MOTRIN) 600 MG tablet Take 1 tablet (600 mg total) by mouth every 8 (eight) hours as needed. 12/14/17   Darel Hong, MD  levothyroxine (SYNTHROID, LEVOTHROID) 137 MCG tablet Take 137 mcg by mouth daily before  breakfast.    [provider]  liothyronine (CYTOMEL) 5 MCG tablet Take 5 mcg by mouth daily. 12/21/20   [provider]  losartan (COZAAR) 50 MG tablet Take 50 mg by mouth daily. 12/17/20   [provider]  propranolol (INDERAL) 20 MG tablet Take 20 mg by mouth 3 (three) times daily.    [provider]  rosuvastatin (CRESTOR) 10 MG tablet Take 10 mg by mouth daily. 01/01/21   [provider]  sertraline (ZOLOFT) 25 MG tablet Take 25 mg by mouth daily. 12/21/20   [provider]  Donnal Debar 200-62.5-25 MCG/ACT AEPB INHALE 1 PUFF ONCE DAILY 01/29/21   Tyler Pita, MD    Physical Exam Vitals: Blood pressure 109/68, height 5\' 3"  (1.6 m), weight 207 lb (93.9 kg).  General: NAD HEENT: normocephalic, anicteric Pulmonary: No increased work of breathing, CTAB Cardiovascular: RRR, distal pulses 2+ Abdomen: soft, non-tender Genitourinary: deferred  Extremities: no edema, erythema, or tenderness Neurologic: Grossly intact Psychiatric: mood appropriate, affect full  Imaging US PELVIC COMPLETE WITH TRANSVAGINAL  Result Date: 01/07/2021 CLINICAL DATA:  Lower pain EXAM: TRANSABDOMINAL AND TRANSVAGINAL ULTRASOUND OF PELVIS TECHNIQUE: Both transabdominal and transvaginal ultrasound examinations of the pelvis were performed. Transabdominal technique was performed for global imaging of the pelvis including uterus, ovaries, adnexal regions, and pelvic cul-de-sac. It was necessary to proceed with endovaginal exam following the transabdominal exam to visualize the uterus endometrium adnexa. COMPARISON:  None FINDINGS: Uterus Measurements: 8.9 x 3.9 x 4.7 cm = volume: 86 mL. No fibroids or other mass visualized. Endometrium Thickness: 8.1 mm.  Tiny cystic areas. Right ovary Not seen Left ovary Measurements: 2.2 x 1.2 x 1.5 cm = volume: 2 mL. Normal appearance/no adnexal mass. Other findings No abnormal free fluid. IMPRESSION: 1. Endometrial thickness of  8.1 mm. Endometrial thickness is considered abnormal for an asymptomatic post-menopausal female. Endometrial sampling should be considered to exclude carcinoma. 2. Nonvisualized right ovary Electronically Signed   By: Donavan Foil M.D.   On: 01/07/2021 16:29    Assessment: 55 y.o. [id number] presenting for scheduled incidental endometrial thickening, non-diagnostic in office biopsy  Plan: 1) I have discussed with the patient the indications for the procedure. Included in the discussion were the options of therapy, as wall as their individual risks, benefits, and complications. Ample time was given to answer all questions.   In office pipelle biopsy generally provides comparable results to [facility], however this sampling modality may miss focal lesions if these were previously documented on ultrasound.  It is because of the potential to miss focal lesions that hysteroscopy D&C is also warranted in patient with continued postmenopausal bleeding that is not self limited regardless of prior in office biopsy results or ultrasound findings.  She understands that the risk of continued observation include worsening bleeding or worsening of any  underlying pathology.  The choices include: 1. Doing nothing but following her symptoms 2. Attempts at hormonal manipulation with either BCP or Depo-Provera for premenopausal patients with no concern for focal lesion or endometrial pathology 3. D&C/hysteroscopy. 4. Endometrial ablation via Novasure or other techniques for premenopausal patients with no concern for focal lesion or endometrial pathology  5. As final resort, hysterectomy. After consideration of her history and findings, mutual decision has been made to proceed with D+C/hysteroscopy. While the incidence is low, the risks from this surgery include, but are not limited to, the risks of anesthesia, hemorrhage, infection, perforation, and injury to adjacent structures including bowel, bladder and blood vessels.    2) Routine  postoperative instructions were reviewed with the patient and her family in detail today including the expected length of recovery and likely postoperative course.  The patient concurred with the proposed plan, giving informed written consent for the surgery today.  Patient instructed on the importance of being NPO after midnight prior to her procedure.  If warranted preoperative prophylactic antibiotics and SCDs ordered on call to the OR to meet SCIP guidelines and adhere to recommendation laid forth in [place] Number 104 May 2009  "Antibiotic Prophylaxis for Gynecologic Procedures".     Malachy Mood, MD, [facility], [facility] 01/30/2021, 2:06 PM

## 2021-01-31 ENCOUNTER — Ambulatory Visit: Payer: BC Managed Care – PPO | Admitting: Anesthesiology

## 2021-01-31 ENCOUNTER — Ambulatory Visit
Admission: RE | Admit: 2021-01-31 | Discharge: 2021-01-31 | Disposition: A | Discharge status: Home / Self Care | Payer: BC Managed Care – PPO | Attending: Obstetrics and Gynecology | Admitting: Obstetrics and Gynecology

## 2021-01-31 ENCOUNTER — Encounter: Payer: Self-pay | Admitting: Obstetrics and Gynecology

## 2021-01-31 ENCOUNTER — Encounter
Admission: RE | Disposition: A | Discharge status: Home / Self Care | Payer: Self-pay | Source: Home / Self Care | Attending: Obstetrics and Gynecology

## 2021-01-31 ENCOUNTER — Other Ambulatory Visit: Payer: Self-pay

## 2021-01-31 DIAGNOSIS — Z885 Allergy status to narcotic agent status: Secondary | ICD-10-CM | POA: Insufficient documentation

## 2021-01-31 DIAGNOSIS — Z20822 Contact with and (suspected) exposure to covid-19: Secondary | ICD-10-CM | POA: Insufficient documentation

## 2021-01-31 DIAGNOSIS — Z7982 Long term (current) use of aspirin: Secondary | ICD-10-CM | POA: Insufficient documentation

## 2021-01-31 DIAGNOSIS — Z6836 Body mass index (BMI) 36.0-36.9, adult: Secondary | ICD-10-CM | POA: Insufficient documentation

## 2021-01-31 DIAGNOSIS — Z01812 Encounter for preprocedural laboratory examination: Secondary | ICD-10-CM | POA: Insufficient documentation

## 2021-01-31 DIAGNOSIS — Z888 Allergy status to other drugs, medicaments and biological substances status: Secondary | ICD-10-CM | POA: Insufficient documentation

## 2021-01-31 DIAGNOSIS — Z79890 Hormone replacement therapy (postmenopausal): Secondary | ICD-10-CM | POA: Insufficient documentation

## 2021-01-31 DIAGNOSIS — Z79899 Other long term (current) drug therapy: Secondary | ICD-10-CM | POA: Insufficient documentation

## 2021-01-31 DIAGNOSIS — N840 Polyp of corpus uteri: Secondary | ICD-10-CM

## 2021-01-31 DIAGNOSIS — K219 Gastro-esophageal reflux disease without esophagitis: Secondary | ICD-10-CM | POA: Insufficient documentation

## 2021-01-31 DIAGNOSIS — R9389 Abnormal findings on diagnostic imaging of other specified body structures: Secondary | ICD-10-CM

## 2021-01-31 DIAGNOSIS — Z8616 Personal history of COVID-19: Secondary | ICD-10-CM | POA: Insufficient documentation

## 2021-01-31 DIAGNOSIS — E039 Hypothyroidism, unspecified: Secondary | ICD-10-CM | POA: Insufficient documentation

## 2021-01-31 DIAGNOSIS — Z7951 Long term (current) use of inhaled steroids: Secondary | ICD-10-CM | POA: Insufficient documentation

## 2021-01-31 DIAGNOSIS — R059 Cough, unspecified: Secondary | ICD-10-CM | POA: Insufficient documentation

## 2021-01-31 DIAGNOSIS — E669 Obesity, unspecified: Secondary | ICD-10-CM | POA: Insufficient documentation

## 2021-01-31 DIAGNOSIS — R002 Palpitations: Secondary | ICD-10-CM | POA: Insufficient documentation

## 2021-01-31 DIAGNOSIS — I10 Essential (primary) hypertension: Secondary | ICD-10-CM | POA: Insufficient documentation

## 2021-01-31 HISTORY — PX: HYSTEROSCOPY WITH D & C: SHX1775

## 2021-01-31 LAB — ABO/RH: ABO/RH(D): O POS

## 2021-01-31 SURGERY — DILATATION AND CURETTAGE /HYSTEROSCOPY
Anesthesia: General

## 2021-01-31 MED ORDER — FENTANYL CITRATE (PF) 100 MCG/2ML IJ SOLN
INTRAMUSCULAR | Status: AC
  Administered 2021-01-31: 25 ug via INTRAVENOUS
  Filled 2021-01-31: qty 2

## 2021-01-31 MED ORDER — CHLORHEXIDINE GLUCONATE 0.12 % MT SOLN
OROMUCOSAL | Status: AC
  Administered 2021-01-31: 15 mL via OROMUCOSAL
  Filled 2021-01-31: qty 15

## 2021-01-31 MED ORDER — DEXAMETHASONE SODIUM PHOSPHATE 10 MG/ML IJ SOLN
INTRAMUSCULAR | Status: DC | PRN
  Administered 2021-01-31: 10 mg via INTRAVENOUS

## 2021-01-31 MED ORDER — GLYCOPYRROLATE 0.2 MG/ML IJ SOLN
INTRAMUSCULAR | Status: DC | PRN
  Administered 2021-01-31: .2 mg via INTRAVENOUS

## 2021-01-31 MED ORDER — OXYCODONE-ACETAMINOPHEN 5-325 MG PO TABS: 1.0000 | ORAL_TABLET | Freq: Four times a day (QID) | ORAL | 0 refills | Status: AC | PRN

## 2021-01-31 MED ORDER — PROMETHAZINE HCL 25 MG/ML IJ SOLN: 6.2500 mg | INTRAMUSCULAR | Status: DC | PRN

## 2021-01-31 MED ORDER — DEXAMETHASONE SODIUM PHOSPHATE 10 MG/ML IJ SOLN
INTRAMUSCULAR | Status: AC
  Filled 2021-01-31: qty 1

## 2021-01-31 MED ORDER — FENTANYL CITRATE (PF) 100 MCG/2ML IJ SOLN
25.0000 ug | INTRAMUSCULAR | Status: AC | PRN
  Administered 2021-01-31 (×4): 25 ug via INTRAVENOUS

## 2021-01-31 MED ORDER — FENTANYL CITRATE (PF) 100 MCG/2ML IJ SOLN
INTRAMUSCULAR | Status: DC | PRN
  Administered 2021-01-31: 50 ug via INTRAVENOUS

## 2021-01-31 MED ORDER — OXYCODONE-ACETAMINOPHEN 5-325 MG PO TABS
ORAL_TABLET | ORAL | Status: AC
  Filled 2021-01-31: qty 1

## 2021-01-31 MED ORDER — LIDOCAINE HCL (CARDIAC) PF 100 MG/5ML IV SOSY
PREFILLED_SYRINGE | INTRAVENOUS | Status: DC | PRN
  Administered 2021-01-31: 100 mg via INTRAVENOUS

## 2021-01-31 MED ORDER — LIDOCAINE HCL (PF) 2 % IJ SOLN
INTRAMUSCULAR | Status: AC
  Filled 2021-01-31: qty 5

## 2021-01-31 MED ORDER — MIDAZOLAM HCL 2 MG/2ML IJ SOLN
INTRAMUSCULAR | Status: AC
  Filled 2021-01-31: qty 2

## 2021-01-31 MED ORDER — MIDAZOLAM HCL 2 MG/2ML IJ SOLN
INTRAMUSCULAR | Status: DC | PRN
  Administered 2021-01-31: 2 mg via INTRAVENOUS

## 2021-01-31 MED ORDER — ONDANSETRON HCL 4 MG/2ML IJ SOLN
INTRAMUSCULAR | Status: DC | PRN
  Administered 2021-01-31: 4 mg via INTRAVENOUS

## 2021-01-31 MED ORDER — ONDANSETRON HCL 4 MG/2ML IJ SOLN
INTRAMUSCULAR | Status: AC
  Filled 2021-01-31: qty 2

## 2021-01-31 MED ORDER — APREPITANT 40 MG PO CAPS
40.0000 mg | ORAL_CAPSULE | Freq: Once | ORAL | Status: AC
  Administered 2021-01-31: 40 mg via ORAL

## 2021-01-31 MED ORDER — APREPITANT 40 MG PO CAPS
ORAL_CAPSULE | ORAL | Status: AC
  Filled 2021-01-31: qty 1

## 2021-01-31 MED ORDER — OXYCODONE-ACETAMINOPHEN 5-325 MG PO TABS
1.0000 | ORAL_TABLET | ORAL | Status: DC | PRN
  Administered 2021-01-31: 1 via ORAL

## 2021-01-31 MED ORDER — FENTANYL CITRATE (PF) 100 MCG/2ML IJ SOLN
INTRAMUSCULAR | Status: AC
  Filled 2021-01-31: qty 2

## 2021-01-31 MED ORDER — SODIUM CHLORIDE 0.9 % IR SOLN
Status: DC | PRN
  Administered 2021-01-31: 1000 mL

## 2021-01-31 MED ORDER — IBUPROFEN 600 MG PO TABS: 600.0000 mg | ORAL_TABLET | Freq: Three times a day (TID) | ORAL | 0 refills | Status: AC | PRN

## 2021-01-31 MED ORDER — PROPOFOL 10 MG/ML IV BOLUS
INTRAVENOUS | Status: DC | PRN
  Administered 2021-01-31: 150 mg via INTRAVENOUS

## 2021-01-31 MED ORDER — GLYCOPYRROLATE 0.2 MG/ML IJ SOLN
INTRAMUSCULAR | Status: AC
  Filled 2021-01-31: qty 1

## 2021-01-31 MED ORDER — 0.9 % SODIUM CHLORIDE (POUR BTL) OPTIME
TOPICAL | Status: DC | PRN
  Administered 2021-01-31: 100 mL

## 2021-01-31 SURGICAL SUPPLY — 18 items
BAG INFUSER PRESSURE 100CC (MISCELLANEOUS) ×1 IMPLANT
CANISTER SUC SOCK COL 7IN (MISCELLANEOUS) ×1 IMPLANT
DEVICE MYOSURE LITE (MISCELLANEOUS) ×1 IMPLANT
GAUZE 4X4 16PLY ~~LOC~~+RFID DBL (SPONGE) ×2 IMPLANT
GLOVE SURG ENC MOIS LTX SZ7 (GLOVE) ×2 IMPLANT
GLOVE SURG UNDER LTX SZ7.5 (GLOVE) ×2 IMPLANT
GOWN STRL REUS W/ TWL LRG LVL3 (GOWN DISPOSABLE) ×2 IMPLANT
GOWN STRL REUS W/TWL LRG LVL3 (GOWN DISPOSABLE) ×4
IV NS 1000ML (IV SOLUTION) ×2
IV NS 1000ML BAXH (IV SOLUTION) IMPLANT
KIT TURNOVER CYSTO (KITS) ×2 IMPLANT
MANIFOLD NEPTUNE II (INSTRUMENTS) ×2 IMPLANT
PACK DNC HYST (MISCELLANEOUS) ×2 IMPLANT
PAD PREP 24X41 OB/GYN DISP (PERSONAL CARE ITEMS) ×2 IMPLANT
SCRUB EXIDINE 4% CHG 4OZ (MISCELLANEOUS) ×2 IMPLANT
SEAL ROD LENS SCOPE MYOSURE (ABLATOR) ×2 IMPLANT
SET CYSTO W/LG BORE CLAMP LF (SET/KITS/TRAYS/PACK) ×1 IMPLANT
WATER STERILE IRR 500ML POUR (IV SOLUTION) ×2 IMPLANT

## 2021-01-31 NOTE — Op Note (Signed)
Preoperative Diagnosis: 1) 55 y.o. with thickened endometrium   Postoperative Diagnosis: 1) 55 y.o. with endometrial polyp  Operation Performed: Hysteroscopy, polypectomy  Indication: Thickened endometrium  Anesthesia: General  Primary Surgeon: Malachy Mood, MD  Assistant: none  Preoperative Antibiotics: none  Estimated Blood Loss: 5 mL  IV Fluids: 75mL  Urine Output:: ~91mL straight cath  Drains or Tubes: none  Implants: none  Specimens Removed: endometrial curettings, polyp  Complications: none  Intraoperative Findings:  Posterior fundal broad based polyp, anterior lower uterine segment broad based polyp.  Otherwise atrophic appearing endometrium, normal tubal ostia bilaterally, normal cervical canal and cervix.  Patient Condition: stable  Procedure in Detail:  Patient was taken to the operating room were she was administered general endotracheal anesthesia.  She was positioned in the dorsal lithotomy position utilizing Allen stirups, prepped and draped in the usual sterile fashion.  Uterus was noted to be non-enlarged in size, anteverted.   Prior to proceeding with the case a time out was performed.  Attention was turned to the patient's pelvis.  A red rubber catheter was used to empty the patient's bladder.  An operative speculum was placed to allow visualization of the cervix.  The anterior lip of the cervix was grasped with a single tooth tenaculum and the cervix was sequentially dilated using pratt dilators.  The hysteroscope was then advanced into the uterine cavity noting the above findings.  The polyp was resected using a light Myosure device and as well as four quadrant sampling of the uterine endometrium  The single tooth tenaculum was removed from the cervix.  The tenaculum sites and cervix were noted to be  Hemostatic before removing the operative speculum.  Sponge needle and instrument counts were corrects times two.  The patient tolerated the procedure well  and was taken to the recovery room in stable condition.

## 2021-01-31 NOTE — Anesthesia Procedure Notes (Signed)
Procedure Name: LMA Insertion Date/Time: 01/31/2021 10:53 AM Performed by: Aline Brochure, CRNA Pre-anesthesia Checklist: Patient identified, Patient being monitored, Timeout performed, Emergency Drugs available and Suction available Patient Re-evaluated:Patient Re-evaluated prior to induction Oxygen Delivery Method: Circle system utilized Preoxygenation: Pre-oxygenation with 100% oxygen Induction Type: IV induction Ventilation: Mask ventilation without difficulty LMA: LMA inserted LMA Size: 3.5 Tube type: Oral Number of attempts: 1 Placement Confirmation: positive ETCO2 and breath sounds checked- equal and bilateral Tube secured with: Tape Dental Injury: Teeth and Oropharynx as per pre-operative assessment

## 2021-01-31 NOTE — Discharge Instructions (Signed)
AMBULATORY SURGERY  ?DISCHARGE INSTRUCTIONS ? ? ?The drugs that you were given will stay in your system until tomorrow so for the next 24 hours you should not: ? ?Drive an automobile ?Make any legal decisions ?Drink any alcoholic beverage ? ? ?You may resume regular meals tomorrow.  Today it is better to start with liquids and gradually work up to solid foods. ? ?You may eat anything you prefer, but it is better to start with liquids, then soup and crackers, and gradually work up to solid foods. ? ? ?Please notify your doctor immediately if you have any unusual bleeding, trouble breathing, redness and pain at the surgery site, drainage, fever, or pain not relieved by medication. ? ? ? ?Additional Instructions: ? ? ? ?Please contact your physician with any problems or Same Day Surgery at [phone], Monday through Friday 6 am to 4 pm, or [facility] [place] Main number at [phone].  ?

## 2021-01-31 NOTE — Interval H&P Note (Signed)
History and Physical Interval Note:  01/31/2021 9:58 AM  Kimberly Rivas  has presented today for surgery, with the diagnosis of Abnormal endometrial ultrasound.  The various methods of treatment have been discussed with the patient and family. After consideration of risks, benefits and other options for treatment, the patient has consented to  Procedure(s): DILATATION AND CURETTAGE /HYSTEROSCOPY (N/A) as a surgical intervention.  The patient's history has been reviewed, patient examined, no change in status, stable for surgery.  I have reviewed the patient's chart and labs.  Questions were answered to the patient's satisfaction.     Malachy Mood

## 2021-01-31 NOTE — Anesthesia Preprocedure Evaluation (Signed)
Anesthesia Evaluation  Patient identified by MRN, date of birth, ID band  Reviewed: Allergy & Precautions, NPO status , Unable to perform ROS - Chart review only  History of Anesthesia Complications Negative for: history of anesthetic complications  Airway Mallampati: III       Dental  (+) Teeth Intact, Dental Advidsory Given   Pulmonary neg shortness of breath, asthma , neg sleep apnea, neg COPD, Recent URI ,    breath sounds clear to auscultation       Cardiovascular Exercise Tolerance: Good hypertension, Pt. on medications and Pt. on home beta blockers (-) angina(-) Past MI and (-) Cardiac Stents + dysrhythmias (-) Valvular Problems/Murmurs Rhythm:Regular Rate:Normal     Neuro/Psych PSYCHIATRIC DISORDERS Anxiety negative neurological ROS     GI/Hepatic Neg liver ROS, GERD  Medicated,  Endo/Other  neg diabetesHypothyroidism   Renal/GU negative Renal ROS     Musculoskeletal   Abdominal Normal abdominal exam  (+)   Peds  Hematology  (+) Blood dyscrasia, anemia ,   Anesthesia Other Findings Past Medical History: No date: Anemia     Comment:  H/O No date: Anxiety No date: Asthma No date: Atypical chest pain No date: Dysrhythmia No date: GERD (gastroesophageal reflux disease) No date: HLD (hyperlipidemia) No date: Hypertension No date: Hypothyroid     Comment:  a. dx'd at age 55 No date: Obese No date: Palpitations     Comment:  a. 48 hr Holter 02/2015: NSR, rare PACs and PVCs.               Otherwise, no significant arrhythmia  No date: Shortness of breath dyspnea     Comment:  RELATED TO ANXIETY PER PT-PT STATES SHE CAN WALK A MILE               WITHOUT GETTING SOB (05-09-15)PT IS SEEING DR KASA FOR A               PULMONARY NODULE SEEN ON CT\   Reproductive/Obstetrics                             Anesthesia Physical  Anesthesia Plan  ASA: 2  Anesthesia Plan: General   Post-op  Pain Management:    Induction: Intravenous  PONV Risk Score and Plan: 3 and Ondansetron, Dexamethasone, Midazolam, Treatment may vary due to age or medical condition and Aprepitant  Airway Management Planned: LMA  Additional Equipment:   Intra-op Plan:   Post-operative Plan: Extubation in OR  Informed Consent: I have reviewed the patients History and Physical, chart, labs and discussed the procedure including the risks, benefits and alternatives for the proposed anesthesia with the patient or authorized representative who has indicated his/her understanding and acceptance.       Plan Discussed with: CRNA  Anesthesia Plan Comments:         Anesthesia Quick Evaluation

## 2021-01-31 NOTE — Transfer of Care (Signed)
Immediate Anesthesia Transfer of Care Note  Patient: Carel Schnee Dismuke  Procedure(s) Performed: DILATATION AND CURETTAGE /HYSTEROSCOPY WITH POLYPECTOMY  Patient Location: PACU  Anesthesia Type:General  Level of Consciousness: sedated  Airway & Oxygen Therapy: Patient Spontanous Breathing and Patient connected to face mask oxygen  Post-op Assessment: Report given to RN and Post -op Vital signs reviewed and stable  Post vital signs: Reviewed and stable  Last Vitals:  Vitals Value Taken Time  BP 125/82   Temp    Pulse 79 01/31/21 1134  Resp 14 01/31/21 1134  SpO2 98 % 01/31/21 1134  Vitals shown include unvalidated device data.  Last Pain:  Vitals:   01/31/21 0911  TempSrc: Oral  PainSc: 0-No pain         Complications: No notable events documented.

## 2021-02-01 ENCOUNTER — Encounter: Payer: Self-pay | Admitting: Obstetrics and Gynecology

## 2021-02-01 LAB — SURGICAL PATHOLOGY

## 2021-02-01 NOTE — Anesthesia Postprocedure Evaluation (Signed)
Anesthesia Post Note  Patient: Yeilyn Gent Honda  Procedure(s) Performed: DILATATION AND CURETTAGE /HYSTEROSCOPY WITH POLYPECTOMY  Patient location during evaluation: PACU Anesthesia Type: General Level of consciousness: awake and alert Pain management: pain level controlled Vital Signs Assessment: post-procedure vital signs reviewed and stable Respiratory status: spontaneous breathing, nonlabored ventilation, respiratory function stable and patient connected to nasal cannula oxygen Cardiovascular status: blood pressure returned to baseline and stable Postop Assessment: no apparent nausea or vomiting Anesthetic complications: no   No notable events documented.   Last Vitals:  Vitals:   01/31/21 1240 01/31/21 1245  BP: 121/77   Pulse: 83 64  Resp: 20 15  Temp:    SpO2: 96% 91%    Last Pain:  Vitals:   01/31/21 1245  TempSrc:   PainSc: 1                  Martha Clan

## 2021-02-07 ENCOUNTER — Encounter: Payer: Self-pay | Admitting: Obstetrics and Gynecology

## 2021-02-07 ENCOUNTER — Other Ambulatory Visit: Payer: Self-pay

## 2021-02-07 ENCOUNTER — Ambulatory Visit (INDEPENDENT_AMBULATORY_CARE_PROVIDER_SITE_OTHER): Payer: BC Managed Care – PPO | Admitting: Obstetrics and Gynecology

## 2021-02-07 VITALS — BP 103/69 | Ht 63.0 in | Wt 208.0 lb

## 2021-02-07 DIAGNOSIS — Z4889 Encounter for other specified surgical aftercare: Secondary | ICD-10-CM

## 2021-02-07 NOTE — Progress Notes (Signed)
      Postoperative Follow-up Patient presents post op from hysteroscopy D&C 1weeks ago for  endometrial polyp .  Subjective: Patient reports marked improvement in her preop symptoms. Eating a regular diet without difficulty. Pain is controlled without any medications.  Activity: normal activities of daily living.  Objective: Blood pressure 103/69, height 5\' 3"  (1.6 m), weight 208 lb (94.3 kg).  General: NAD Pulmonary: no increased work of breathing Extremities: no edema Neurologic: normal gait   Admission on 01/31/2021, Discharged on 01/31/2021  Component Date Value Ref Range Status   ABO/RH(D) 01/31/2021    Final                   Value:O POS Performed at [facility], [address] [place], [place] [address]    SURGICAL PATHOLOGY 01/31/2021    Final-Edited                   Value:SURGICAL PATHOLOGY CASE: [id number] PATIENT: Tyson Dense Surgical Pathology Report     Specimen Submitted: A. Endometrial polyps  Clinical History: Abnormal endometrial ultrasound    DIAGNOSIS: A. ENDOMETRIUM; POLYPECTOMY AND CURETTAGE: - FRAGMENTS OF BENIGN ENDOMETRIAL POLYPS. - WEAKLY PROLIFERATIVE ENDOMETRIUM. - NEGATIVE FOR ATYPICAL HYPERPLASIA/EIN AND MALIGNANCY.  GROSS DESCRIPTION: A. Labeled: Endometrial polyps Received: Fresh Collection time: 11:21 AM on 01/31/2021 Placed into formalin time: 12:11 PM on 01/31/2021 Tissue fragment(s): Multiple Size: Aggregate, 2.5 x 1.5 x 0.3 cm Description: Received within a white mesh collection bag are fragments of tan-white soft tissue. Entirely submitted in 1 cassette.  RB 01/31/2021  Final Diagnosis performed by Allena Napoleon, MD.   Electronically signed 02/01/2021 10:12:50AM The electronic signature indicates that the named Attending Pathologist has evaluated the specimen Technical component perfo                         rmed at [facility], [address], [place], [place] [address] Lab: [phone] Dir: Rush Farmer, MD, MMM  Professional component performed at [facility], [facility], [place], [place], [place] [address] Lab: [phone] Dir: Kathi Simpers, MD     Assessment: 55 y.o. s/p hysteroscopy D&C stable  Plan: Patient has done well after surgery with no apparent complications.  I have discussed the post-operative course to date, and the expected progress moving forward.  The patient understands what complications to be concerned about.  I will see the patient in routine follow up, or sooner if needed.    Activity plan: No restriction.   Malachy Mood, MD, [facility], [facility] 02/07/2021, 10:07 AM

## 2021-02-13 ENCOUNTER — Ambulatory Visit: Payer: BC Managed Care – PPO | Admitting: Obstetrics and Gynecology

## 2021-03-13 ENCOUNTER — Ambulatory Visit: Payer: BC Managed Care – PPO | Admitting: Obstetrics and Gynecology

## 2021-04-10 ENCOUNTER — Other Ambulatory Visit: Payer: Self-pay

## 2021-04-10 ENCOUNTER — Ambulatory Visit (INDEPENDENT_AMBULATORY_CARE_PROVIDER_SITE_OTHER): Payer: BC Managed Care – PPO | Admitting: Obstetrics and Gynecology

## 2021-04-10 ENCOUNTER — Encounter: Payer: Self-pay | Admitting: Obstetrics and Gynecology

## 2021-04-10 VITALS — BP 142/86 | Ht 63.0 in | Wt 210.0 lb

## 2021-04-10 DIAGNOSIS — Z4889 Encounter for other specified surgical aftercare: Secondary | ICD-10-CM

## 2021-04-10 DIAGNOSIS — Z1231 Encounter for screening mammogram for malignant neoplasm of breast: Secondary | ICD-10-CM

## 2021-04-10 NOTE — Patient Instructions (Signed)
[facility] [address] [place] [address]  [facility]  [address] [place] [address]  Phone: [phone]  

## 2021-04-10 NOTE — Progress Notes (Signed)
° ° ° ° °  Postoperative Follow-up Patient presents post op from hysteroscopy, D&C 6weeks ago for  PMB and endometrial polyp .  Subjective: Patient reports marked improvement in her preop symptoms. Eating a regular diet without difficulty. The patient is not having any pain.  Activity: normal activities of daily living.  Objective: Blood pressure (!) 142/86, height 5\' 3"  (1.6 m), weight 210 lb (95.3 kg).  General: NAD Pulmonary: no increased work of breathing GU: normal external female genitalia normal cervix, no CMT, uterus normal in shape and contour, no adnexal tenderness or masses Extremities: no edema Neurologic: normal gait   Admission on 01/31/2021, Discharged on 01/31/2021  Component Date Value Ref Range Status   ABO/RH(D) 01/31/2021    Final                   Value:O POS Performed at [facility], [address], [place], [place] [address]    SURGICAL PATHOLOGY 01/31/2021    Final-Edited                   Value:SURGICAL PATHOLOGY CASE: [id number] PATIENT: Kimberly Rivas Surgical Pathology Report     Specimen Submitted: A. Endometrial polyps  Clinical History: Abnormal endometrial ultrasound    DIAGNOSIS: A. ENDOMETRIUM; POLYPECTOMY AND CURETTAGE: - FRAGMENTS OF BENIGN ENDOMETRIAL POLYPS. - WEAKLY PROLIFERATIVE ENDOMETRIUM. - NEGATIVE FOR ATYPICAL HYPERPLASIA/EIN AND MALIGNANCY.  GROSS DESCRIPTION: A. Labeled: Endometrial polyps Received: Fresh Collection time: 11:21 AM on 01/31/2021 Placed into formalin time: 12:11 PM on 01/31/2021 Tissue fragment(s): Multiple Size: Aggregate, 2.5 x 1.5 x 0.3 cm Description: Received within a white mesh collection bag are fragments of tan-white soft tissue. Entirely submitted in 1 cassette.  RB 01/31/2021  Final Diagnosis performed by Allena Napoleon, MD.   Electronically signed 02/01/2021 10:12:50AM The electronic signature indicates that the named Attending Pathologist has evaluated the  specimen Technical component perfo                         rmed at [facility], [address], [place], [place] [address] Lab: [phone] Dir: Rush Farmer, MD, MMM  Professional component performed at [facility], [facility], [place], [place], [place] [address] Lab: [phone] Dir: Kathi Simpers, MD     Assessment: 56 y.o. s/p hysteroscopy D&C stable  Plan: Patient has done well after surgery with no apparent complications.  I have discussed the post-operative course to date, and the expected progress moving forward.  The patient understands what complications to be concerned about.  I will see the patient in routine follow up, or sooner if needed.    Activity plan: No restriction.  Mammogram ordered  Return in about 1 year (around 04/10/2022) for annual.    Malachy Mood, MD, [place], [facility] 04/10/2021, 11:35 AM

## 2021-08-06 ENCOUNTER — Other Ambulatory Visit: Payer: Self-pay | Admitting: Pulmonary Disease

## 2021-09-08 ENCOUNTER — Other Ambulatory Visit: Payer: Self-pay | Admitting: Pulmonary Disease

## 2021-09-09 ENCOUNTER — Other Ambulatory Visit: Payer: Self-pay | Admitting: Pulmonary Disease

## 2021-09-09 ENCOUNTER — Telehealth: Payer: Self-pay | Admitting: Pulmonary Disease

## 2021-09-09 NOTE — Telephone Encounter (Signed)
Patient last seen 08/2020--appt is needed prior to refills.  ATC patient--unable to leave vm due to mailbox being full.

## 2021-09-10 ENCOUNTER — Telehealth: Payer: Self-pay | Admitting: Pulmonary Disease

## 2021-09-10 MED ORDER — TRELEGY ELLIPTA 200-62.5-25 MCG/ACT IN AEPB: INHALATION_SPRAY | RESPIRATORY_TRACT | 1 refills | Status: DC

## 2021-09-10 NOTE — Addendum Note (Signed)
Addended by: Claudette Head A on: 09/10/2021 05:01 PM   Modules accepted: Orders

## 2021-09-10 NOTE — Telephone Encounter (Signed)
Trelegy sent to preferred pharmacy.  Patient is aware and voiced her understanding.  Nothing further needed.

## 2021-09-10 NOTE — Telephone Encounter (Signed)
ATC X2--Unable to leave vm due to mailbox being full. Will close encounter per office protocol.

## 2021-09-12 NOTE — Telephone Encounter (Signed)
error 

## 2021-11-05 ENCOUNTER — Other Ambulatory Visit: Payer: Self-pay | Admitting: Pulmonary Disease

## 2021-12-05 ENCOUNTER — Ambulatory Visit: Payer: BC Managed Care – PPO | Admitting: Pulmonary Disease

## 2021-12-05 ENCOUNTER — Encounter: Payer: Self-pay | Admitting: Pulmonary Disease

## 2021-12-05 VITALS — BP 126/80 | HR 86 | Temp 97.8°F | Ht 63.0 in | Wt 212.8 lb

## 2021-12-05 DIAGNOSIS — J4540 Moderate persistent asthma, uncomplicated: Secondary | ICD-10-CM | POA: Diagnosis not present

## 2021-12-05 DIAGNOSIS — Z9189 Other specified personal risk factors, not elsewhere classified: Secondary | ICD-10-CM | POA: Diagnosis not present

## 2021-12-05 DIAGNOSIS — E669 Obesity, unspecified: Secondary | ICD-10-CM | POA: Diagnosis not present

## 2021-12-05 MED ORDER — TRELEGY ELLIPTA 200-62.5-25 MCG/ACT IN AEPB: 1.0000 | INHALATION_SPRAY | Freq: Every day | RESPIRATORY_TRACT | 11 refills | Status: DC

## 2021-12-05 NOTE — Progress Notes (Signed)
Subjective:    Patient ID: Kimberly Rivas, female    DOB: 16-Jun-1965, 56 y.o.   MRN: [id number] Patient Care Team: Casilda Carls, MD as PCP - General (Internal Medicine) Wellington Hampshire, MD as Consulting Physician (Cardiology)  Chief Complaint  Patient presents with   Follow-up    SOB with exertion and occ during sleep.     HPI Kimberly Rivas is a 56 year old lifelong never smoker who presents for follow-up on the issue of shortness of breath and cough. She was last seen here on 07 Aug 2020, she had gotten lost to follow-up, this is a scheduled visit. She carries a diagnosis of asthma. Since her prior visit she has had her Inderal decreased however could not be tapered off of it is used as migraine prophylaxis. She is on losartan for blood pressure control. He has been having issues with management of her thyroid but this is being addressed by endocrinology. She notes improvement of symptoms with Trelegy Ellipta but still gets shortness of breath at nighttime which requires albuterol use. She has not had any fevers, chills or sweats. Overall she feels that she is improving. Significant gastroesophageal reflux symptoms.  She has engaged in weight loss.  She has had some nocturnal awakenings feeling that she "gasps". Still having some issues with nonrestorative sleep.  Previously had a negative sleep study in 2016.   TEST/EVENTS :  Sleep study 2016 WNL ONO wnl 2017 PFT ratio 84% WNL, Fev1 84% FVC 79% 6MWT wnl Abs eos 04/13/15; 400 CT chest 08/05/2017: Stable right upper lobe nodule/scar no other abnormalities COVID-19 test April 06, 2019 positive 06/05/2020 alpha 1: Phenotype MM, level 148 mg/dL 06/05/2020 allergen panel: Negative, IgE 12  Review of Systems A 10 point review of systems was performed and it is as noted above otherwise negative.  Patient Active Problem List   Diagnosis Date Noted   Endometrial polyp    COVID-19 virus infection 04/22/2019   Cough 04/22/2019   Hypothyroid     Chest tightness    Obese    Chest pressure 02/16/2015   Palpitations 02/16/2015   Social History   Tobacco Use   Smoking status: Never   Smokeless tobacco: Never  Substance Use Topics   Alcohol use: Not Currently   Allergies  Allergen Reactions   Hydrocodone Nausea Only   Tramadol Nausea And Vomiting   Current Meds  Medication Sig   albuterol (PROAIR HFA) 108 (90 Base) MCG/ACT inhaler Inhale 1-2 puffs into the lungs every 6 (six) hours as needed for wheezing or shortness of breath.   ALPRAZolam (XANAX) 0.25 MG tablet Take 0.125-0.25 mg by mouth daily as needed for anxiety.   aspirin EC 81 MG tablet Take 81 mg by mouth daily.   cetirizine (ZYRTEC) 10 MG tablet Take 10 mg by mouth daily as needed for allergies.   cyclobenzaprine (FLEXERIL) 10 MG tablet Take 1 tablet (10 mg total) by mouth 3 (three) times daily as needed for muscle spasms.   famotidine (PEPCID) 20 MG tablet Take 20 mg by mouth daily as needed for heartburn or indigestion.   hydrochlorothiazide (HYDRODIURIL) 25 MG tablet Take 25 mg by mouth daily.   ibuprofen (ADVIL) 600 MG tablet Take 1 tablet (600 mg total) by mouth every 8 (eight) hours as needed for mild pain or cramping.   levothyroxine (SYNTHROID, LEVOTHROID) 137 MCG tablet Take 137 mcg by mouth daily before breakfast.   liothyronine (CYTOMEL) 5 MCG tablet Take 5 mcg by mouth daily.  losartan (COZAAR) 50 MG tablet Take 50 mg by mouth daily.   oxyCODONE-acetaminophen (PERCOCET) 5-325 MG tablet Take 1 tablet by mouth every 6 (six) hours as needed for severe pain.   propranolol (INDERAL) 20 MG tablet Take 20 mg by mouth 3 (three) times daily.   rosuvastatin (CRESTOR) 10 MG tablet Take 10 mg by mouth daily.   sertraline (ZOLOFT) 25 MG tablet Take 25 mg by mouth daily.   [place] 200-62.5-25 MCG/ACT AEPB INHALE 1 PUFF ONCE DAILY       Objective:   Physical Exam BP 126/80 (BP Location: Left Arm, Cuff Size: Normal)   Pulse 86   Temp 97.8 F (36.6 C)  (Temporal)   Ht '5\' 3"'$  (1.6 m)   Wt 212 lb 12.8 oz (96.5 kg)   SpO2 97%   BMI 37.70 kg/m  GENERAL: Obese woman, no acute distress, no conversational dyspnea.  Occasional deep congested sounding cough. HEAD: Normocephalic, atraumatic.  EYES: Pupils equal, round, reactive to light.  No scleral icterus.  Mild periorbital puffiness (allergic shiners). MOUTH: Nose/mouth/throat not examined due to masking requirements for COVID 19. NECK: Supple. No thyromegaly. Trachea midline. No JVD.  No adenopathy. PULMONARY: Good air entry bilaterally.  Coarse otherwise, no adventitious sounds. CARDIOVASCULAR: S1 and S2. Regular rate and rhythm.  No rubs, murmurs or gallops heard. ABDOMEN: Bees otherwise benign. MUSCULOSKELETAL: No joint deformity, no clubbing, no edema.  NEUROLOGIC: No focal deficit, speech is fluent, no gait disturbance. SKIN: Intact,warm,dry.  On limited exam no rashes. PSYCH: Mood and behavior is normal.     12/05/2021    3:00 PM  Results of the Epworth flowsheet  Sitting and reading 0  Watching TV 2  Sitting, inactive in a public place (e.g. a theatre or a meeting) 0  As a passenger in a car for an hour without a break 0  Lying down to rest in the afternoon when circumstances permit 0  Sitting and talking to someone 0  Sitting quietly after a lunch without alcohol 0  In a car, while stopped for a few minutes in traffic 0  Total score 2       Assessment & Plan:     ICD-10-CM   1. Moderate persistent asthma, unspecified whether complicated  123456 Pulmonary Function Test [facility] Only   Continue Trelegy Continue as needed albuterol Would benefit from getting off nonselective beta-blocker    2. At risk for sleep apnea  Z91.89 Home sleep test   Will obtain home sleep study Nonrestorative sleep Nocturnal awakenings    3. Obesity, Class II, BMI 35-39.9, isolated (see actual BMI)  E66.9    This issue adds complexity to her management She is engaged in weight loss     Orders  Placed This Encounter  Procedures   Pulmonary Function Test [facility] Only    Standing Status:   Future    Number of Occurrences:   1    Standing Expiration Date:   12/06/2022    Scheduling Instructions:     Oct 2023    Order Specific Question:   Full PFT: includes the following: basic spirometry, spirometry pre & post bronchodilator, diffusion capacity (DLCO), lung volumes    Answer:   Full PFT   Home sleep test    Standing Status:   Future    Number of Occurrences:   1    Standing Expiration Date:   12/06/2022    Order Specific Question:   Where should this test be performed:  Answer:   [facility] - Pulmonary   See the patient in follow-up in 2 to 3 months time, she is to call sooner should any new problems arise.  Renold Don, MD Advanced Bronchoscopy PCCM [facility]    *This note was dictated using voice recognition software/Dragon.  Despite best efforts to proofread, errors can occur which can change the meaning. Any transcriptional errors that result from this process are unintentional and may not be fully corrected at the time of dictation.

## 2021-12-05 NOTE — Patient Instructions (Addendum)
We are going to get a breathing test.  We are going to get a home sleep test.  We will see you in follow-up in 2 to 3 months time call sooner should any new problems arise.

## 2022-02-04 ENCOUNTER — Other Ambulatory Visit: Payer: Self-pay | Admitting: Internal Medicine

## 2022-02-04 DIAGNOSIS — Z1231 Encounter for screening mammogram for malignant neoplasm of breast: Secondary | ICD-10-CM

## 2022-02-07 ENCOUNTER — Ambulatory Visit (INDEPENDENT_AMBULATORY_CARE_PROVIDER_SITE_OTHER): Payer: BC Managed Care – PPO

## 2022-02-07 DIAGNOSIS — Z9189 Other specified personal risk factors, not elsewhere classified: Secondary | ICD-10-CM

## 2022-02-13 ENCOUNTER — Ambulatory Visit (INDEPENDENT_AMBULATORY_CARE_PROVIDER_SITE_OTHER): Payer: BC Managed Care – PPO | Admitting: Pulmonary Disease

## 2022-02-13 ENCOUNTER — Ambulatory Visit: Payer: BC Managed Care – PPO | Attending: Pulmonary Disease

## 2022-02-13 ENCOUNTER — Encounter: Payer: Self-pay | Admitting: Pulmonary Disease

## 2022-02-13 VITALS — BP 130/84 | HR 68 | Temp 98.1°F | Ht 63.0 in | Wt 209.6 lb

## 2022-02-13 DIAGNOSIS — R0602 Shortness of breath: Secondary | ICD-10-CM | POA: Diagnosis not present

## 2022-02-13 DIAGNOSIS — Z23 Encounter for immunization: Secondary | ICD-10-CM | POA: Diagnosis not present

## 2022-02-13 DIAGNOSIS — J4540 Moderate persistent asthma, uncomplicated: Secondary | ICD-10-CM | POA: Insufficient documentation

## 2022-02-13 DIAGNOSIS — E669 Obesity, unspecified: Secondary | ICD-10-CM

## 2022-02-13 DIAGNOSIS — Z9189 Other specified personal risk factors, not elsewhere classified: Secondary | ICD-10-CM | POA: Diagnosis not present

## 2022-02-13 LAB — NITRIC OXIDE: Nitric Oxide: 18

## 2022-02-13 NOTE — Progress Notes (Signed)
Subjective:    Patient ID: Kimberly Rivas, female    DOB: 1965-09-04, 56 y.o.   MRN: [id number] Patient Care Team: Casilda Carls, MD as PCP - General (Internal Medicine) Wellington Hampshire, MD as Consulting Physician (Cardiology) Tyler Pita, MD as Consulting Physician (Pulmonary Disease)  Chief Complaint  Patient presents with   Follow-up    Asthma. PFT results. SOB with exertion. No wheezing or cough.    HPI Kimberly Rivas is a 56 year old lifelong never smoker who presents for follow-up on the issue of shortness of breath and cough.  Cough has been related to asthma previously.  She was last seen here on 05 December 2021, this is a scheduled visit.  At her prior visit we ordered pulmonary function testing which were done today and sleep study which is due on 30 November.  She is on losartan for blood pressure control. He has been having issues with management of her thyroid but this is being addressed by endocrinology. She notes improvement of symptoms of asthma with Trelegy Ellipta she has not required nocturnal albuterol dosing since her last visit. She has not had any fevers, chills or sweats. Overall she feels that she is improving.  Esophageal reflux symptoms are better controlled.  Still having some issues with nonrestorative sleep.  She does not endorse any other symptomatology today.  Desires to have the flu vaccine today.    TEST/EVENTS :  Sleep study 2016 WNL ONO wnl 2017 06/05/2020 PFTs: ratio 84% WNL, Fev1 84% FVC 79% 6MWT wnl Abs eos 04/13/15; 400 CT chest 08/05/2017: Stable right upper lobe nodule/scar no other abnormalities COVID-19 test April 06, 2019 positive 06/05/2020 alpha 1: Phenotype MM, level 148 mg/dL 06/05/2020 allergen panel: Negative, IgE 12 02/13/2022 PFT: FEV1 89% FVC 80%, FEV1/FVC 86% diffusion capacity corrects for alveolar volume  Review of Systems A 10 point review of systems was performed and it is as noted above otherwise negative.   Patient  Active Problem List   Diagnosis Date Noted   Endometrial polyp    COVID-19 virus infection 04/22/2019   Cough 04/22/2019   Hypothyroid    Chest tightness    Obese    Chest pressure 02/16/2015   Palpitations 02/16/2015   Social History   Tobacco Use   Smoking status: Never   Smokeless tobacco: Never  Substance Use Topics   Alcohol use: Not Currently   Allergies  Allergen Reactions   Hydrocodone Nausea Only   Tramadol Nausea And Vomiting   Current Meds  Medication Sig   albuterol (PROAIR HFA) 108 (90 Base) MCG/ACT inhaler Inhale 1-2 puffs into the lungs every 6 (six) hours as needed for wheezing or shortness of breath.   ALPRAZolam (XANAX) 0.25 MG tablet Take 0.125-0.25 mg by mouth daily as needed for anxiety.   aspirin EC 81 MG tablet Take 81 mg by mouth daily.   cetirizine (ZYRTEC) 10 MG tablet Take 10 mg by mouth daily as needed for allergies.   cyclobenzaprine (FLEXERIL) 10 MG tablet Take 1 tablet (10 mg total) by mouth 3 (three) times daily as needed for muscle spasms.   famotidine (PEPCID) 20 MG tablet Take 20 mg by mouth daily as needed for heartburn or indigestion.   Fluticasone-Umeclidin-Vilant (TRELEGY ELLIPTA) 200-62.5-25 MCG/ACT AEPB Inhale 1 puff into the lungs daily.   hydrochlorothiazide (HYDRODIURIL) 25 MG tablet Take 25 mg by mouth daily.   ibuprofen (ADVIL) 600 MG tablet Take 1 tablet (600 mg total) by mouth every 8 (eight) hours as needed  for mild pain or cramping.   levothyroxine (SYNTHROID, LEVOTHROID) 137 MCG tablet Take 137 mcg by mouth daily before breakfast.   losartan (COZAAR) 50 MG tablet Take 50 mg by mouth daily.   propranolol (INDERAL) 20 MG tablet Take 20 mg by mouth 3 (three) times daily.   rosuvastatin (CRESTOR) 10 MG tablet Take 10 mg by mouth daily.   sertraline (ZOLOFT) 25 MG tablet Take 25 mg by mouth daily.   Immunization History  Administered Date(s) Administered   Influenza,inj,Quad PF,6+ Mos 02/13/2022   Influenza-Unspecified  01/20/2019   PFIZER(Purple Top)SARS-COV-2 Vaccination 07/07/2019, 08/02/2019       Objective:   Physical Exam BP 130/84 (BP Location: Left Arm, Cuff Size: Normal)   Pulse 68   Temp 98.1 F (36.7 C)   Ht '5\' 3"'$  (1.6 m)   Wt 209 lb 9.6 oz (95.1 kg)   SpO2 95%   BMI 37.13 kg/m  GENERAL: Obese woman, no acute distress, no conversational dyspnea.  HEAD: Normocephalic, atraumatic.  EYES: Pupils equal, round, reactive to light.  No scleral icterus.  Mild periorbital puffiness (allergic shiners). MOUTH: Teeth intact, oral mucosa moist. NECK: Supple. No thyromegaly. Trachea midline. No JVD.  No adenopathy. PULMONARY: Good air entry bilaterally.  Coarse otherwise, no adventitious sounds. CARDIOVASCULAR: S1 and S2. Regular rate and rhythm.  No rubs, murmurs or gallops heard. ABDOMEN: Bees otherwise benign. MUSCULOSKELETAL: No joint deformity, no clubbing, no edema.  NEUROLOGIC: No focal deficit, speech is fluent, no gait disturbance. SKIN: Intact,warm,dry.  On limited exam no rashes. PSYCH: Mood and behavior is normal.  Lab Results  Component Value Date   NITRICOXIDE 18 02/13/2022       Assessment & Plan:     ICD-10-CM   1. Moderate persistent asthma without complication  T26.71 Nitric oxide   Well compensated on Trelegy Ellipta Continue Trelegy Continue as needed albuterol    2. SOB (shortness of breath)  R06.02    No worsening Overall better    3. Obesity, Class II, BMI 35-39.9, isolated (see actual BMI)  E66.9    Weight loss recommended    4. At risk for sleep apnea  Z91.89    Has not had sleep study yet Sleep study 03/06/2022    5. Need for immunization against influenza  Z23 Flu Vaccine QUAD 43moIM (Fluarix, Fluzone & Alfiuria Quad PF)   Received flu vaccine today     Orders Placed This Encounter  Procedures   Flu Vaccine QUAD 648moM (Fluarix, Fluzone & Alfiuria Quad PF)   Nitric oxide   Overall and she appears to be stable.  We discussed some of the issues  with shortness of breath may be related to excessive weight and she is working on trying to correct these.  She has a sleep study scheduled which is pending.  Her PFTs were essentially unremarkable today.  Her asthma appears well-controlled with her nitric oxide being 18 today.  No significant change from prior.  We will see the patient in follow-up in 2 to 3 months time she is to contact usKorearior to that time should any new difficulties arise.  C.Renold DonMD Advanced Bronchoscopy PCCM [facility]    *This note was dictated using voice recognition software/Dragon.  Despite best efforts to proofread, errors can occur which can change the meaning. Any transcriptional errors that result from this process are unintentional and may not be fully corrected at the time of dictation.

## 2022-02-13 NOTE — Patient Instructions (Signed)
Continue your Trelegy as you are doing.  Her asthma seems to be controlled today.  We have the results of your study as soon as these are noted.   See you back in follow-up in 2 to 3 months time call sooner should any new problems arise.

## 2022-02-17 LAB — PULMONARY FUNCTION TEST ARMC ONLY
DL/VA % pred: 88 %
DL/VA: 3.76 ml/min/mmHg/L
DLCO unc % pred: 54 %
DLCO unc: 10.66 ml/min/mmHg
FEF 25-75 Post: 2.6 L/sec
FEF 25-75 Pre: 3.02 L/sec
FEF2575-%Change-Post: -14 %
FEF2575-%Pred-Post: 109 %
FEF2575-%Pred-Pre: 127 %
FEV1-%Change-Post: -3 %
FEV1-%Pred-Post: 85 %
FEV1-%Pred-Pre: 89 %
FEV1-Post: 2.16 L
FEV1-Pre: 2.25 L
FEV1FVC-%Change-Post: 0 %
FEV1FVC-%Pred-Pre: 109 %
FEV6-%Change-Post: -4 %
FEV6-%Pred-Post: 79 %
FEV6-%Pred-Pre: 83 %
FEV6-Post: 2.5 L
FEV6-Pre: 2.6 L
FEV6FVC-%Pred-Post: 103 %
FEV6FVC-%Pred-Pre: 103 %
FVC-%Change-Post: -4 %
FVC-%Pred-Post: 77 %
FVC-%Pred-Pre: 80 %
FVC-Post: 2.5 L
FVC-Pre: 2.61 L
Post FEV1/FVC ratio: 86 %
Post FEV6/FVC ratio: 100 %
Pre FEV1/FVC ratio: 86 %
Pre FEV6/FVC Ratio: 100 %
RV % pred: 79 %
RV: 1.51 L
TLC % pred: 95 %
TLC: 4.68 L

## 2022-03-06 DIAGNOSIS — G4733 Obstructive sleep apnea (adult) (pediatric): Secondary | ICD-10-CM

## 2022-03-07 DIAGNOSIS — G4733 Obstructive sleep apnea (adult) (pediatric): Secondary | ICD-10-CM | POA: Diagnosis not present

## 2022-03-13 ENCOUNTER — Other Ambulatory Visit: Payer: Self-pay

## 2022-03-13 DIAGNOSIS — G4733 Obstructive sleep apnea (adult) (pediatric): Secondary | ICD-10-CM

## 2022-03-17 ENCOUNTER — Telehealth: Payer: Self-pay

## 2022-03-17 ENCOUNTER — Other Ambulatory Visit: Payer: Self-pay

## 2022-03-17 DIAGNOSIS — Z1211 Encounter for screening for malignant neoplasm of colon: Secondary | ICD-10-CM

## 2022-03-17 MED ORDER — NA SULFATE-K SULFATE-MG SULF 17.5-3.13-1.6 GM/177ML PO SOLN: 1.0000 | Freq: Once | ORAL | 0 refills | Status: AC

## 2022-03-17 NOTE — Telephone Encounter (Signed)
Gastroenterology Pre-Procedure Review  Request Date: 04/14/22 Requesting Physician: Dr. Marius Ditch  PATIENT REVIEW QUESTIONS: The patient responded to the following health history questions as indicated:    1. Are you having any GI issues? no 2. Do you have a personal history of Polyps? no 3. Do you have a family history of Colon Cancer or Polyps? no 4. Diabetes Mellitus? no 5. Joint replacements in the past 12 months?no 6. Major health problems in the past 3 months?no 7. Any artificial heart valves, MVP, or defibrillator?no    MEDICATIONS & ALLERGIES:    Patient reports the following regarding taking any anticoagulation/antiplatelet therapy:   Plavix, Coumadin, Eliquis, Xarelto, Lovenox, Pradaxa, Brilinta, or Effient? no Aspirin? no  Patient confirms/reports the following medications:  Current Outpatient Medications  Medication Sig Dispense Refill   albuterol (PROAIR HFA) 108 (90 Base) MCG/ACT inhaler Inhale 1-2 puffs into the lungs every 6 (six) hours as needed for wheezing or shortness of breath. 1 each 5   ALPRAZolam (XANAX) 0.25 MG tablet Take 0.125-0.25 mg by mouth daily as needed for anxiety.     aspirin EC 81 MG tablet Take 81 mg by mouth daily.     cetirizine (ZYRTEC) 10 MG tablet Take 10 mg by mouth daily as needed for allergies.     cyclobenzaprine (FLEXERIL) 10 MG tablet Take 1 tablet (10 mg total) by mouth 3 (three) times daily as needed for muscle spasms. 30 tablet 0   famotidine (PEPCID) 20 MG tablet Take 20 mg by mouth daily as needed for heartburn or indigestion.     Fluticasone-Umeclidin-Vilant (TRELEGY ELLIPTA) 200-62.5-25 MCG/ACT AEPB Inhale 1 puff into the lungs daily. 60 each 11   hydrochlorothiazide (HYDRODIURIL) 25 MG tablet Take 25 mg by mouth daily.     ibuprofen (ADVIL) 600 MG tablet Take 1 tablet (600 mg total) by mouth every 8 (eight) hours as needed for mild pain or cramping. 30 tablet 0   levothyroxine (SYNTHROID, LEVOTHROID) 137 MCG tablet Take 137 mcg by  mouth daily before breakfast.     liothyronine (CYTOMEL) 5 MCG tablet Take 5 mcg by mouth daily. (Patient not taking: Reported on 02/13/2022)     losartan (COZAAR) 50 MG tablet Take 50 mg by mouth daily.     propranolol (INDERAL) 20 MG tablet Take 20 mg by mouth 3 (three) times daily.     rosuvastatin (CRESTOR) 10 MG tablet Take 10 mg by mouth daily.     sertraline (ZOLOFT) 25 MG tablet Take 25 mg by mouth daily.     No current facility-administered medications for this visit.    Patient confirms/reports the following allergies:  Allergies  Allergen Reactions   Hydrocodone Nausea Only   Tramadol Nausea And Vomiting    No orders of the defined types were placed in this encounter.   AUTHORIZATION INFORMATION Primary Insurance: 1D#: Group #:  Secondary Insurance: 1D#: Group #:  SCHEDULE INFORMATION: Date: 04/14/22 Time: Location: [facility]

## 2022-03-20 ENCOUNTER — Encounter: Payer: Self-pay | Admitting: Pulmonary Disease

## 2022-03-20 ENCOUNTER — Encounter: Payer: Self-pay | Admitting: Internal Medicine

## 2022-03-26 ENCOUNTER — Telehealth: Payer: Self-pay | Admitting: Pulmonary Disease

## 2022-03-26 MED ORDER — ALBUTEROL SULFATE HFA 108 (90 BASE) MCG/ACT IN AERS: 1.0000 | INHALATION_SPRAY | Freq: Four times a day (QID) | RESPIRATORY_TRACT | 5 refills | Status: DC | PRN

## 2022-03-26 NOTE — Telephone Encounter (Signed)
I spoke with the patient. She was needing her Albuterol inhaler refilled. I sent the prescription to her pharmacy and notified her. Nothing further needed.

## 2022-04-14 ENCOUNTER — Encounter
Admission: RE | Disposition: A | Discharge status: Home / Self Care | Payer: Self-pay | Source: Home / Self Care | Attending: Gastroenterology

## 2022-04-14 ENCOUNTER — Ambulatory Visit: Payer: No Typology Code available for payment source | Admitting: Certified Registered"

## 2022-04-14 ENCOUNTER — Encounter: Payer: Self-pay | Admitting: Gastroenterology

## 2022-04-14 ENCOUNTER — Ambulatory Visit
Admission: RE | Admit: 2022-04-14 | Discharge: 2022-04-14 | Disposition: A | Discharge status: Home / Self Care | Payer: No Typology Code available for payment source | Attending: Gastroenterology | Admitting: Gastroenterology

## 2022-04-14 DIAGNOSIS — D122 Benign neoplasm of ascending colon: Secondary | ICD-10-CM | POA: Diagnosis not present

## 2022-04-14 DIAGNOSIS — I10 Essential (primary) hypertension: Secondary | ICD-10-CM | POA: Insufficient documentation

## 2022-04-14 DIAGNOSIS — J45909 Unspecified asthma, uncomplicated: Secondary | ICD-10-CM | POA: Insufficient documentation

## 2022-04-14 DIAGNOSIS — Z79890 Hormone replacement therapy (postmenopausal): Secondary | ICD-10-CM | POA: Diagnosis not present

## 2022-04-14 DIAGNOSIS — E785 Hyperlipidemia, unspecified: Secondary | ICD-10-CM | POA: Insufficient documentation

## 2022-04-14 DIAGNOSIS — K635 Polyp of colon: Secondary | ICD-10-CM | POA: Diagnosis not present

## 2022-04-14 DIAGNOSIS — K219 Gastro-esophageal reflux disease without esophagitis: Secondary | ICD-10-CM | POA: Diagnosis not present

## 2022-04-14 DIAGNOSIS — E669 Obesity, unspecified: Secondary | ICD-10-CM | POA: Diagnosis not present

## 2022-04-14 DIAGNOSIS — D123 Benign neoplasm of transverse colon: Secondary | ICD-10-CM | POA: Insufficient documentation

## 2022-04-14 DIAGNOSIS — F419 Anxiety disorder, unspecified: Secondary | ICD-10-CM | POA: Insufficient documentation

## 2022-04-14 DIAGNOSIS — Z6836 Body mass index (BMI) 36.0-36.9, adult: Secondary | ICD-10-CM | POA: Diagnosis not present

## 2022-04-14 DIAGNOSIS — D124 Benign neoplasm of descending colon: Secondary | ICD-10-CM | POA: Diagnosis not present

## 2022-04-14 DIAGNOSIS — Z79899 Other long term (current) drug therapy: Secondary | ICD-10-CM | POA: Insufficient documentation

## 2022-04-14 DIAGNOSIS — E039 Hypothyroidism, unspecified: Secondary | ICD-10-CM | POA: Insufficient documentation

## 2022-04-14 DIAGNOSIS — Z713 Dietary counseling and surveillance: Secondary | ICD-10-CM

## 2022-04-14 DIAGNOSIS — Z7951 Long term (current) use of inhaled steroids: Secondary | ICD-10-CM | POA: Diagnosis not present

## 2022-04-14 DIAGNOSIS — Z1211 Encounter for screening for malignant neoplasm of colon: Secondary | ICD-10-CM | POA: Insufficient documentation

## 2022-04-14 HISTORY — PX: COLONOSCOPY WITH PROPOFOL: SHX5780

## 2022-04-14 SURGERY — COLONOSCOPY WITH PROPOFOL
Anesthesia: General

## 2022-04-14 MED ORDER — PROPOFOL 500 MG/50ML IV EMUL
INTRAVENOUS | Status: DC | PRN
  Administered 2022-04-14: 70 ug/kg/min via INTRAVENOUS

## 2022-04-14 MED ORDER — PROPOFOL 10 MG/ML IV BOLUS: INTRAVENOUS | Status: DC | PRN

## 2022-04-14 MED ORDER — DEXMEDETOMIDINE HCL IN NACL 80 MCG/20ML IV SOLN
INTRAVENOUS | Status: DC | PRN
  Administered 2022-04-14: 8 ug via BUCCAL

## 2022-04-14 MED ORDER — PROPOFOL 10 MG/ML IV BOLUS
INTRAVENOUS | Status: DC | PRN
  Administered 2022-04-14: 10 mg via INTRAVENOUS
  Administered 2022-04-14: 70 mg via INTRAVENOUS

## 2022-04-14 MED ORDER — LIDOCAINE HCL (CARDIAC) PF 100 MG/5ML IV SOSY
PREFILLED_SYRINGE | INTRAVENOUS | Status: DC | PRN
  Administered 2022-04-14: 50 mg via INTRAVENOUS

## 2022-04-14 MED ORDER — STERILE WATER FOR IRRIGATION IR SOLN
Status: DC | PRN
  Administered 2022-04-14: .06 mL

## 2022-04-14 MED ORDER — PHENYLEPHRINE HCL (PRESSORS) 10 MG/ML IV SOLN
INTRAVENOUS | Status: DC | PRN
  Administered 2022-04-14: 80 ug via INTRAVENOUS

## 2022-04-14 MED ORDER — PROPOFOL 1000 MG/100ML IV EMUL
INTRAVENOUS | Status: AC
  Filled 2022-04-14: qty 100

## 2022-04-14 MED ORDER — SODIUM CHLORIDE 0.9 % IV SOLN
INTRAVENOUS | Status: DC
  Administered 2022-04-14: 1000 mL via INTRAVENOUS

## 2022-04-14 NOTE — Op Note (Signed)
[facility] Gastroenterology Patient Name: Kimberly Rivas Procedure Date: 04/14/2022 7:07 AM MRN: [id number] Account #: [id number] Date of Birth: [birth year] Admit Type: Outpatient Age: 57 Room: [facility] ENDO ROOM 4 Gender: Female Note Status: Finalized Instrument Name: Jasper Riling [id number] Procedure:             Colonoscopy Indications:           Screening for colorectal malignant neoplasm Providers:             Lin Landsman MD, MD Referring MD:          Casilda Carls, MD (Referring MD) Medicines:             General Anesthesia Complications:         No immediate complications. Estimated blood loss: None. Procedure:             Pre-Anesthesia Assessment:                        - Prior to the procedure, a History and Physical was                         performed, and patient medications and allergies were                         reviewed. The patient is competent. The risks and                         benefits of the procedure and the sedation options and                         risks were discussed with the patient. All questions                         were answered and informed consent was obtained.                         Patient identification and proposed procedure were                         verified by the physician, the nurse, the                         anesthesiologist, the anesthetist and the technician                         in the pre-procedure area in the procedure room in the                         endoscopy suite. Mental Status Examination: alert and                         oriented. Airway Examination: normal oropharyngeal                         airway and neck mobility. Respiratory Examination:                         clear to auscultation. CV Examination: normal.  Prophylactic Antibiotics: The patient does not require                         prophylactic antibiotics. Prior Anticoagulants: The                          patient has taken no anticoagulant or antiplatelet                         agents. ASA Grade Assessment: II - A patient with mild                         systemic disease. After reviewing the risks and                         benefits, the patient was deemed in satisfactory                         condition to undergo the procedure. The anesthesia                         plan was to use general anesthesia. Immediately prior                         to administration of medications, the patient was                         re-assessed for adequacy to receive sedatives. The                         heart rate, respiratory rate, oxygen saturations,                         blood pressure, adequacy of pulmonary ventilation, and                         response to care were monitored throughout the                         procedure. The physical status of the patient was                         re-assessed after the procedure.                        After obtaining informed consent, the colonoscope was                         passed under direct vision. Throughout the procedure,                         the patient's blood pressure, pulse, and oxygen                         saturations were monitored continuously. The                         Colonoscope was introduced through the anus and  advanced to the the cecum, identified by appendiceal                         orifice and ileocecal valve. The colonoscopy was                         performed without difficulty. The patient tolerated                         the procedure well. The quality of the bowel                         preparation was evaluated using the BBPS [facility] Bowel                         Preparation Scale) with scores of: Right Colon = 3,                         Transverse Colon = 3 and Left Colon = 3 (entire mucosa                         seen well with no residual staining, small fragments                          of stool or opaque liquid). The total BBPS score                         equals 9. The ileocecal valve, appendiceal orifice,                         and rectum were photographed. Findings:      The perianal and digital rectal examinations were normal. Pertinent       negatives include normal sphincter tone and no palpable rectal lesions.      Two sessile polyps were found in the ascending colon. The polyps were 3       to 4 mm in size. These polyps were removed with a cold snare. Resection       and retrieval were complete.      Three sessile polyps were found in the transverse colon. The polyps were       3 to 5 mm in size. These polyps were removed with a cold snare.       Resection and retrieval were complete. Estimated blood loss: none.      A 3 mm polyp was found in the descending colon. The polyp was sessile.       The polyp was removed with a cold snare. Resection and retrieval were       complete.      The retroflexed view of the distal rectum and anal verge was normal and       showed no anal or rectal abnormalities. Impression:            - Two 3 to 4 mm polyps in the ascending colon, removed                         with a cold snare. Resected and retrieved.                        -  Three 3 to 5 mm polyps in the transverse colon,                         removed with a cold snare. Resected and retrieved.                        - One 3 mm polyp in the descending colon, removed with                         a cold snare. Resected and retrieved.                        - The distal rectum and anal verge are normal on                         retroflexion view. Recommendation:        - Discharge patient to home (with escort).                        - Resume previous diet today.                        - Continue present medications.                        - Await pathology results.                        - Repeat colonoscopy in 3 years for surveillance of                          multiple polyps. Procedure Code(s):     --- Professional ---                        [phone], Colonoscopy, flexible; with removal of                         tumor(s), polyp(s), or other lesion(s) by snare                         technique Diagnosis Code(s):     --- Professional ---                        D12.3, Benign neoplasm of transverse colon (hepatic                         flexure or splenic flexure)                        D12.2, Benign neoplasm of ascending colon                        Z12.11, Encounter for screening for malignant neoplasm                         of colon                        D12.4, Benign neoplasm of descending colon CPT copyright 2022 American  Medical Association. All rights reserved. The codes documented in this report are preliminary and upon coder review may  be revised to meet current compliance requirements. Dr. Ulyess Mort Lin Landsman MD, MD 04/14/2022 8:18:50 AM This report has been signed electronically. Number of Addenda: 0 Note Initiated On: 04/14/2022 7:07 AM Scope Withdrawal Time: 0 hours 13 minutes 58 seconds  Total Procedure Duration: 0 hours 16 minutes 51 seconds  Estimated Blood Loss:  Estimated blood loss: none.      [facility]

## 2022-04-14 NOTE — Transfer of Care (Signed)
Immediate Anesthesia Transfer of Care Note  Patient: Kimberly Rivas  Procedure(s) Performed: COLONOSCOPY WITH PROPOFOL  Patient Location: PACU and Endoscopy Unit  Anesthesia Type:General  Level of Consciousness: awake and drowsy  Airway & Oxygen Therapy: Patient Spontanous Breathing and Patient connected to nasal cannula oxygen  Post-op Assessment: Report given to RN and Post -op Vital signs reviewed and stable  Post vital signs: Reviewed and stable  Last Vitals:  Vitals Value Taken Time  BP 103/70 04/14/22 0828  Temp 36.4 C 04/14/22 0820  Pulse 67 04/14/22 0825  Resp 16 04/14/22 0829  SpO2 94 % 04/14/22 0825  Vitals shown include unvalidated device data.  Last Pain:  Vitals:   04/14/22 0820  TempSrc: Temporal  PainSc: Asleep         Complications: No notable events documented.

## 2022-04-14 NOTE — Anesthesia Preprocedure Evaluation (Signed)
Anesthesia Evaluation  Patient identified by MRN, date of birth, ID band  Reviewed: Allergy & Precautions, NPO status , Unable to perform ROS - Chart review only  History of Anesthesia Complications Negative for: history of anesthetic complications  Airway Mallampati: III       Dental  (+) Teeth Intact, Dental Advidsory Given   Pulmonary neg shortness of breath, asthma , neg sleep apnea, neg COPD, Recent URI    breath sounds clear to auscultation       Cardiovascular Exercise Tolerance: Good hypertension, Pt. on medications and Pt. on home beta blockers (-) angina (-) Past MI and (-) Cardiac Stents + dysrhythmias (-) Valvular Problems/Murmurs Rhythm:Regular Rate:Normal     Neuro/Psych  PSYCHIATRIC DISORDERS Anxiety     negative neurological ROS     GI/Hepatic Neg liver ROS,GERD  Medicated,,  Endo/Other  neg diabetesHypothyroidism    Renal/GU negative Renal ROS     Musculoskeletal   Abdominal Normal abdominal exam  (+)   Peds  Hematology  (+) Blood dyscrasia, anemia   Anesthesia Other Findings Past Medical History: No date: Anemia     Comment:  H/O No date: Anxiety No date: Asthma No date: Atypical chest pain No date: Dysrhythmia No date: GERD (gastroesophageal reflux disease) No date: HLD (hyperlipidemia) No date: Hypertension No date: Hypothyroid     Comment:  a. dx'd at age 57 No date: Obese No date: Palpitations     Comment:  a. 48 hr Holter 02/2015: NSR, rare PACs and PVCs.               Otherwise, no significant arrhythmia  No date: Shortness of breath dyspnea     Comment:  RELATED TO ANXIETY PER PT-PT STATES SHE CAN WALK A MILE               WITHOUT GETTING SOB (05-09-15)PT IS SEEING DR KASA FOR A               PULMONARY NODULE SEEN ON CT\   Reproductive/Obstetrics                             Anesthesia Physical Anesthesia Plan  ASA: 2  Anesthesia Plan: General    Post-op Pain Management: Minimal or no pain anticipated   Induction: Intravenous  PONV Risk Score and Plan: 3 and Propofol infusion, TIVA and Ondansetron  Airway Management Planned: Nasal Cannula and Natural Airway  Additional Equipment: None  Intra-op Plan:   Post-operative Plan: Extubation in OR  Informed Consent: I have reviewed the patients History and Physical, chart, labs and discussed the procedure including the risks, benefits and alternatives for the proposed anesthesia with the patient or authorized representative who has indicated his/her understanding and acceptance.     Dental advisory given  Plan Discussed with: CRNA  Anesthesia Plan Comments: (Discussed risks of anesthesia with patient, including possibility of difficulty with spontaneous ventilation under anesthesia necessitating airway intervention, PONV, and rare risks such as cardiac or respiratory or neurological events, and allergic reactions. Discussed the role of CRNA in patient's perioperative care. Patient understands.)        Anesthesia Quick Evaluation

## 2022-04-14 NOTE — H&P (Signed)
Cephas Darby, MD [address]  [place]  [place], [place] [address]  Main: [phone]  Fax: [phone] Pager: [phone]  Primary Care Physician:  Kimberly Carls, MD Primary Gastroenterologist:  Dr. Cephas Darby  Pre-Procedure History & Physical: HPI:  Kimberly Rivas is a 57 y.o. female is here for an colonoscopy.   Past Medical History:  Diagnosis Date   Anemia    H/O   Anxiety    Asthma    Atypical chest pain    Dysrhythmia    GERD (gastroesophageal reflux disease)    HLD (hyperlipidemia)    Hypertension    Hypothyroid    a. dx'd at age 9   Obese    Palpitations    a. 48 hr Holter 02/2015: NSR, rare PACs and PVCs. Otherwise, no significant arrhythmia    Shortness of breath dyspnea    RELATED TO ANXIETY PER PT-PT STATES SHE CAN WALK A MILE WITHOUT GETTING SOB (05-09-15)PT IS SEEING DR Mortimer Fries FOR A PULMONARY NODULE SEEN ON CT    Past Surgical History:  Procedure Laterality Date   BREAST REDUCTION SURGERY     CESAREAN SECTION     X3   COLONOSCOPY     DILATION AND CURETTAGE OF UTERUS     HYSTEROSCOPY WITH D & C N/A 01/31/2021   Procedure: DILATATION AND CURETTAGE /HYSTEROSCOPY WITH POLYPECTOMY;  Surgeon: Malachy Mood, MD;  Location: [facility] ORS;  Service: Gynecology;  Laterality: N/A;   KNEE ARTHROSCOPY Left 05/10/2015   Procedure: ARTHROSCOPY KNEE, partial medial meniscectomy;  Surgeon: Hessie Knows, MD;  Location: [facility] ORS;  Service: Orthopedics;  Laterality: Left;   REDUCTION MAMMAPLASTY Bilateral 04/07/1996    Prior to Admission medications   Medication Sig Start Date End Date Taking? Authorizing Provider  aspirin EC 81 MG tablet Take 81 mg by mouth daily.   Yes [provider]  cetirizine (ZYRTEC) 10 MG tablet Take 10 mg by mouth daily as needed for allergies. 01/09/21  Yes [provider]  cyclobenzaprine (FLEXERIL) 10 MG tablet Take 1 tablet (10 mg total) by mouth 3 (three) times daily as needed for muscle spasms. 12/14/17  Yes  Darel Hong, MD  famotidine (PEPCID) 20 MG tablet Take 20 mg by mouth daily as needed for heartburn or indigestion.   Yes [provider]  Fluticasone-Umeclidin-Vilant (TRELEGY ELLIPTA) 200-62.5-25 MCG/ACT AEPB Inhale 1 puff into the lungs daily. 12/05/21  Yes Tyler Pita, MD  hydrochlorothiazide (HYDRODIURIL) 25 MG tablet Take 25 mg by mouth daily. 05/10/20  Yes [provider]  levothyroxine (SYNTHROID, LEVOTHROID) 137 MCG tablet Take 137 mcg by mouth daily before breakfast.   Yes [provider]  losartan (COZAAR) 50 MG tablet Take 50 mg by mouth daily. 12/17/20  Yes [provider]  propranolol (INDERAL) 20 MG tablet Take 20 mg by mouth 3 (three) times daily.   Yes [provider]  rosuvastatin (CRESTOR) 10 MG tablet Take 10 mg by mouth daily. 01/01/21  Yes [provider]  sertraline (ZOLOFT) 25 MG tablet Take 25 mg by mouth daily. 12/21/20  Yes [provider]  albuterol (PROAIR HFA) 108 (90 Base) MCG/ACT inhaler Inhale 1-2 puffs into the lungs every 6 (six) hours as needed for wheezing or shortness of breath. 03/26/22   Tyler Pita, MD  ALPRAZolam Duanne Moron) 0.25 MG tablet Take 0.125-0.25 mg by mouth daily as needed for anxiety. 08/15/20   [provider]  ibuprofen (ADVIL) 600 MG tablet Take 1 tablet (600 mg  total) by mouth every 8 (eight) hours as needed for mild pain or cramping. 01/31/21   Malachy Mood, MD  liothyronine (CYTOMEL) 5 MCG tablet Take 5 mcg by mouth daily. Patient not taking: Reported on 02/13/2022 12/21/20   [provider]    Allergies as of 03/17/2022 - Review Complete 03/17/2022  Allergen Reaction Noted   Hydrocodone Nausea Only 05/14/2015   Tramadol Nausea And Vomiting 04/30/2015    Family History  Problem Relation Age of Onset   Acute myelogenous leukemia Mother    COPD Mother    Heart Problems Mother    Hypertension Mother    Hypothyroidism Mother    Stroke Mother     Stroke Father    Epilepsy Brother    Ovarian cancer Paternal Aunt        52s   Heart Problems Paternal Grandmother    Heart Problems Paternal Grandfather    Breast cancer Neg Hx     Social History   Socioeconomic History   Marital status: Married    Spouse name: Not on file   Number of children: Not on file   Years of education: Not on file   Highest education level: Not on file  Occupational History   Not on file  Tobacco Use   Smoking status: Never   Smokeless tobacco: Never  Vaping Use   Vaping Use: Never used  Substance and Sexual Activity   Alcohol use: Not Currently   Drug use: No   Sexual activity: Yes    Birth control/protection: Post-menopausal  Other Topics Concern   Not on file  Social History Narrative   Not on file   Social Determinants of Health   Financial Resource Strain: Not on file  Food Insecurity: Not on file  Transportation Needs: Not on file  Physical Activity: Not on file  Stress: Not on file  Social Connections: Not on file  Intimate Partner Violence: Not on file    Review of Systems: See HPI, otherwise negative ROS  Physical Exam: BP 121/83   Pulse 66   Temp (!) 96.9 F (36.1 C) (Temporal)   Resp 18   Ht '5\' 3"'$  (1.6 m)   Wt 92.7 kg   SpO2 97%   BMI 36.21 kg/m  General:   Alert,  pleasant and cooperative in NAD Head:  Normocephalic and atraumatic. Neck:  Supple; no masses or thyromegaly. Lungs:  Clear throughout to auscultation.    Heart:  Regular rate and rhythm. Abdomen:  Soft, nontender and nondistended. Normal bowel sounds, without guarding, and without rebound.   Neurologic:  Alert and  oriented x4;  grossly normal neurologically.  Impression/Plan: Kimberly Rivas is here for an colonoscopy to be performed for colon cancer screening  Risks, benefits, limitations, and alternatives regarding  colonoscopy have been reviewed with the patient.  Questions have been answered.  All parties agreeable.   [name], MD   04/14/2022, 7:48 AM

## 2022-04-14 NOTE — Anesthesia Postprocedure Evaluation (Signed)
Anesthesia Post Note  Patient: Kimberly Rivas  Procedure(s) Performed: COLONOSCOPY WITH PROPOFOL  Patient location during evaluation: Endoscopy Anesthesia Type: General Level of consciousness: awake and alert Pain management: pain level controlled Vital Signs Assessment: post-procedure vital signs reviewed and stable Respiratory status: spontaneous breathing, nonlabored ventilation, respiratory function stable and patient connected to nasal cannula oxygen Cardiovascular status: blood pressure returned to baseline and stable Postop Assessment: no apparent nausea or vomiting Anesthetic complications: no  No notable events documented.   Last Vitals:  Vitals:   04/14/22 0830 04/14/22 0840  BP: 108/79 104/85  Pulse: 65 68  Resp: 16 15  Temp:    SpO2: 99% 94%    Last Pain:  Vitals:   04/14/22 0840  TempSrc:   PainSc: 0-No pain                 Dimas Millin

## 2022-04-14 NOTE — Anesthesia Procedure Notes (Signed)
Procedure Name: MAC Date/Time: 04/14/2022 7:55 AM  Performed by: Biagio Borg, CRNAPre-anesthesia Checklist: Patient identified, Emergency Drugs available, Suction available, Patient being monitored and Timeout performed Patient Re-evaluated:Patient Re-evaluated prior to induction Oxygen Delivery Method: Nasal cannula Induction Type: IV induction Placement Confirmation: positive ETCO2 and CO2 detector

## 2022-04-15 ENCOUNTER — Encounter: Payer: Self-pay | Admitting: Gastroenterology

## 2022-04-15 LAB — SURGICAL PATHOLOGY

## 2022-05-08 ENCOUNTER — Ambulatory Visit
Admission: RE | Admit: 2022-05-08 | Discharge: 2022-05-08 | Disposition: A | Discharge status: Home / Self Care | Payer: No Typology Code available for payment source | Source: Ambulatory Visit | Attending: Internal Medicine | Admitting: Internal Medicine

## 2022-05-08 DIAGNOSIS — Z1231 Encounter for screening mammogram for malignant neoplasm of breast: Secondary | ICD-10-CM

## 2022-05-31 ENCOUNTER — Encounter: Payer: Self-pay | Admitting: Pulmonary Disease

## 2022-07-10 ENCOUNTER — Ambulatory Visit: Payer: No Typology Code available for payment source | Admitting: Pulmonary Disease

## 2022-07-10 ENCOUNTER — Encounter: Payer: Self-pay | Admitting: Pulmonary Disease

## 2022-07-10 VITALS — BP 120/80 | HR 69 | Temp 97.7°F | Ht 63.0 in | Wt 214.4 lb

## 2022-07-10 DIAGNOSIS — G4733 Obstructive sleep apnea (adult) (pediatric): Secondary | ICD-10-CM

## 2022-07-10 DIAGNOSIS — R0602 Shortness of breath: Secondary | ICD-10-CM | POA: Diagnosis not present

## 2022-07-10 DIAGNOSIS — E669 Obesity, unspecified: Secondary | ICD-10-CM | POA: Diagnosis not present

## 2022-07-10 DIAGNOSIS — J4540 Moderate persistent asthma, uncomplicated: Secondary | ICD-10-CM | POA: Diagnosis not present

## 2022-07-10 NOTE — Patient Instructions (Signed)
Your compliance with the CPAP is excellent.  The CPAP is also controlling your sleep apnea this is good news.  Your lungs sounded really clear today.  This is also very good.  We will see you in follow-up in 6 months time call sooner should any new problems arise.

## 2022-07-10 NOTE — Progress Notes (Signed)
Subjective:    Patient ID: Kimberly Rivas, female    DOB: Jul 27, 1965, 57 y.o.   MRN: [id number] Patient Care Team: Casilda Carls, MD as PCP - General (Internal Medicine) Wellington Hampshire, MD as Consulting Physician (Cardiology) Tyler Pita, MD as Consulting Physician (Pulmonary Disease)  Chief Complaint  Patient presents with   Follow-up    SOB with exertion. No wheezing. Cough with white sputum. CPAP is going good. Mask and pressure are ok.    HPI Kimberly Rivas is a 57 year old lifelong never smoker who presents for follow-up on the issue of shortness of breath and cough.  This is a scheduled visit.  She was last seen on 13 February 2022, this is a scheduled visit.  Cough has been noted to be secondary to moderate persistent asthma, it is now resolved on Trelegy. She notes improvement of symptoms of asthma with Trelegy Ellipta she has not required rescue albuterol dosing since her last visit. She has not had any fevers, chills or sweats. Overall she feels that she is improving.  Esophageal reflux symptoms are better controlled.  She did have a sleep study performed on 06 March 2022 that was consistent with mild to moderate obstructive sleep apnea with AHI of 14.  She is currently on CPAP at 5 to 15 cm H2O.  She has been very compliant with the CPAP therapy compliance averaging anywhere between 93 to 100%.  Residual AHI is 2.2.  CPAP pressures fluctuate between 7.8 and 12.3.  She notes more restorative sleep with the device.  She does not endorse any other symptomatology today.  Overall she feels well and looks well.  TEST/EVENTS :  Sleep study 2016 WNL ONO wnl 2017 06/05/2020 PFTs: ratio 84% WNL, Fev1 84% FVC 79% 6MWT wnl Abs eos 04/13/15; 400 CT chest 08/05/2017: Stable right upper lobe nodule/scar no other abnormalities COVID-19 test April 06, 2019 positive 06/05/2020 alpha 1: Phenotype MM, level 148 mg/dL 06/05/2020 allergen panel: Negative, IgE 12 02/13/2022 PFT: FEV1 89% FVC  80%, FEV1/FVC 86% diffusion capacity corrects for alveolar volume 03/06/2022 HST: Mild to moderate obstructive sleep apnea with AHI of 14 and SpO2 nadir of 84%  Review of Systems A 10 point review of systems was performed and it is as noted above otherwise negative.  Patient Active Problem List   Diagnosis Date Noted   Encounter for screening colonoscopy 04/14/2022   Polyp of colon 04/14/2022   Endometrial polyp    COVID-19 virus infection 04/22/2019   Cough 04/22/2019   Hypothyroid    Chest tightness    Obese    Chest pressure 02/16/2015   Palpitations 02/16/2015   Social History   Tobacco Use   Smoking status: Never   Smokeless tobacco: Never  Substance Use Topics   Alcohol use: Not Currently   Allergies  Allergen Reactions   Hydrocodone Nausea Only   Tramadol Nausea And Vomiting   Current Meds  Medication Sig   albuterol (PROAIR HFA) 108 (90 Base) MCG/ACT inhaler Inhale 1-2 puffs into the lungs every 6 (six) hours as needed for wheezing or shortness of breath.   ALPRAZolam (XANAX) 0.25 MG tablet Take 0.125-0.25 mg by mouth daily as needed for anxiety.   aspirin EC 81 MG tablet Take 81 mg by mouth daily.   cetirizine (ZYRTEC) 10 MG tablet Take 10 mg by mouth daily as needed for allergies.   cyclobenzaprine (FLEXERIL) 10 MG tablet Take 1 tablet (10 mg total) by mouth 3 (three) times daily as needed for  muscle spasms.   famotidine (PEPCID) 20 MG tablet Take 20 mg by mouth daily as needed for heartburn or indigestion.   Fluticasone-Umeclidin-Vilant (TRELEGY ELLIPTA) 200-62.5-25 MCG/ACT AEPB Inhale 1 puff into the lungs daily.   hydrochlorothiazide (HYDRODIURIL) 25 MG tablet Take 25 mg by mouth daily.   ibuprofen (ADVIL) 600 MG tablet Take 1 tablet (600 mg total) by mouth every 8 (eight) hours as needed for mild pain or cramping.   levothyroxine (SYNTHROID) 150 MCG tablet Take 150 mcg by mouth every morning.   losartan (COZAAR) 50 MG tablet Take 50 mg by mouth daily.    propranolol (INDERAL) 20 MG tablet Take 20 mg by mouth 3 (three) times daily.   rosuvastatin (CRESTOR) 10 MG tablet Take 10 mg by mouth daily.   sertraline (ZOLOFT) 25 MG tablet Take 25 mg by mouth daily.   Immunization History  Administered Date(s) Administered   Influenza,inj,Quad PF,6+ Mos 02/13/2022   Influenza-Unspecified 01/20/2019   PFIZER(Purple Top)SARS-COV-2 Vaccination 07/07/2019, 08/02/2019       Objective:   Physical Exam BP 120/80 (BP Location: Left Arm, Cuff Size: Large)   Pulse 69   Temp 97.7 F (36.5 C)   Ht 5\' 3"  (1.6 m)   Wt 214 lb 6.4 oz (97.3 kg)   SpO2 94%   BMI 37.98 kg/m   SpO2: 94 % O2 Device: None (Room air)  GENERAL: Obese woman, no acute distress, no conversational dyspnea.  HEAD: Normocephalic, atraumatic.  EYES: Pupils equal, round, reactive to light.  No scleral icterus.  Mild periorbital puffiness (allergic shiners). MOUTH: Teeth intact, oral mucosa moist. NECK: Supple. No thyromegaly. Trachea midline. No JVD.  No adenopathy. PULMONARY: Good air entry bilaterally.  Coarse otherwise, no adventitious sounds. CARDIOVASCULAR: S1 and S2. Regular rate and rhythm.  No rubs, murmurs or gallops heard. ABDOMEN: Bees otherwise benign. MUSCULOSKELETAL: No joint deformity, no clubbing, no edema.  NEUROLOGIC: No focal deficit, speech is fluent, no gait disturbance. SKIN: Intact,warm,dry.  On limited exam no rashes. PSYCH: Mood and behavior is normal.     Assessment & Plan:     ICD-10-CM   1. Moderate persistent asthma without complication  123456    Controlled on Trelegy Ellipta and as needed albuterol Continue same    2. SOB (shortness of breath)  R06.02    Marked improvement with Trelegy    3. OSA (obstructive sleep apnea)  G47.33    CPAP 5-15 cmH2O Tolerating well Patient compliant    4. Obesity, Class II, BMI 35-39.9, isolated (see actual BMI)  E66.9    Weight loss recommended     Currently doing very well.  She is well compensated.   She is compliant with CPAP.  Will see the patient in follow-up in 6 months time she is to contact us prior to that time should any new difficulties arise.  Renold Don, MD Advanced Bronchoscopy PCCM [facility]    *This note was dictated using voice recognition software/Dragon.  Despite best efforts to proofread, errors can occur which can change the meaning. Any transcriptional errors that result from this process are unintentional and may not be fully corrected at the time of dictation.

## 2022-07-30 ENCOUNTER — Telehealth: Payer: Self-pay | Admitting: Pulmonary Disease

## 2022-07-30 MED ORDER — METHYLPREDNISOLONE 4 MG PO TBPK: ORAL_TABLET | ORAL | 0 refills | Status: DC

## 2022-07-30 NOTE — Telephone Encounter (Signed)
I have sent in the prescription and notified the patient.  Nothing further needed. 

## 2022-07-30 NOTE — Telephone Encounter (Signed)
Lets send in a Medrol Dosepak.  Suspect is related to the seasonal change.  Make sure she is taking her Zyrtec at bedtime.

## 2022-07-30 NOTE — Telephone Encounter (Signed)
I spoke with the patient. Symptoms started the day after her last office visit- 07/11/2022. No F/C/S A lot of wheezing Cough with white sputum SOB has increased- she is having to use her rescue inhaler more. She said it has got some better but still having symptoms  Covid test was Negative  Using Albuterol atleast once a day now. Trelegy- 1 puff daily Claritin - 1 daily

## 2022-07-30 NOTE — Telephone Encounter (Signed)
Pt states she has been sick for last few weeks poss asthma flare and is still wheezy.

## 2022-08-28 ENCOUNTER — Other Ambulatory Visit: Payer: Self-pay | Admitting: Internal Medicine

## 2022-08-28 DIAGNOSIS — I200 Unstable angina: Secondary | ICD-10-CM

## 2022-09-03 ENCOUNTER — Ambulatory Visit (INDEPENDENT_AMBULATORY_CARE_PROVIDER_SITE_OTHER): Payer: 59

## 2022-09-03 DIAGNOSIS — I200 Unstable angina: Secondary | ICD-10-CM | POA: Diagnosis not present

## 2022-09-03 MED ORDER — TECHNETIUM TC 99M SESTAMIBI GENERIC - CARDIOLITE
34.9000 | Freq: Once | INTRAVENOUS | Status: AC | PRN
  Administered 2022-09-03: 34.9 via INTRAVENOUS

## 2022-09-03 MED ORDER — TECHNETIUM TC 99M SESTAMIBI GENERIC - CARDIOLITE
10.4000 | Freq: Once | INTRAVENOUS | Status: AC | PRN
  Administered 2022-09-03: 10.4 via INTRAVENOUS

## 2022-10-08 ENCOUNTER — Other Ambulatory Visit: Payer: Self-pay | Admitting: Internal Medicine

## 2022-10-08 DIAGNOSIS — R059 Cough, unspecified: Secondary | ICD-10-CM

## 2022-10-08 DIAGNOSIS — R918 Other nonspecific abnormal finding of lung field: Secondary | ICD-10-CM

## 2022-10-15 ENCOUNTER — Ambulatory Visit
Admission: RE | Admit: 2022-10-15 | Discharge: 2022-10-15 | Disposition: A | Discharge status: Home / Self Care | Payer: 59 | Source: Ambulatory Visit | Attending: Internal Medicine | Admitting: Internal Medicine

## 2022-10-15 DIAGNOSIS — R059 Cough, unspecified: Secondary | ICD-10-CM | POA: Diagnosis present

## 2022-10-15 DIAGNOSIS — R918 Other nonspecific abnormal finding of lung field: Secondary | ICD-10-CM | POA: Insufficient documentation

## 2022-10-15 MED ORDER — IOHEXOL 300 MG/ML  SOLN
75.0000 mL | Freq: Once | INTRAMUSCULAR | Status: AC | PRN
  Administered 2022-10-15: 75 mL via INTRAVENOUS

## 2022-11-18 ENCOUNTER — Other Ambulatory Visit: Payer: Self-pay | Admitting: Pulmonary Disease

## 2023-01-04 ENCOUNTER — Other Ambulatory Visit: Payer: Self-pay | Admitting: Pulmonary Disease

## 2023-01-06 ENCOUNTER — Ambulatory Visit: Payer: 59 | Admitting: Pulmonary Disease

## 2023-01-15 ENCOUNTER — Ambulatory Visit: Payer: No Typology Code available for payment source | Admitting: Pulmonary Disease

## 2023-01-15 ENCOUNTER — Encounter: Payer: Self-pay | Admitting: Pulmonary Disease

## 2023-01-15 VITALS — BP 124/84 | HR 66 | Temp 98.2°F | Ht 63.0 in | Wt 213.0 lb

## 2023-01-15 DIAGNOSIS — G4733 Obstructive sleep apnea (adult) (pediatric): Secondary | ICD-10-CM

## 2023-01-15 DIAGNOSIS — E66812 Obesity, class 2: Secondary | ICD-10-CM

## 2023-01-15 DIAGNOSIS — R0602 Shortness of breath: Secondary | ICD-10-CM | POA: Diagnosis not present

## 2023-01-15 DIAGNOSIS — Z23 Encounter for immunization: Secondary | ICD-10-CM

## 2023-01-15 DIAGNOSIS — J4540 Moderate persistent asthma, uncomplicated: Secondary | ICD-10-CM

## 2023-01-15 NOTE — Patient Instructions (Signed)
Your lungs sounded clear today.  We have sent request for a [employer]ear silicone pillows mask to [employer].  You received your flu vaccine today.  Will see you in follow-up in 3 to 4 months time call sooner should any new problems arise.

## 2023-01-15 NOTE — Progress Notes (Signed)
Subjective:    Patient ID: Kimberly Rivas, female    DOB: [birth year], 57 y.o.   MRN: [id number]  Patient Care Team: Sherrie Mustache, MD as PCP - General (Internal Medicine) Iran Ouch, MD as Consulting Physician (Cardiology) Salena Saner, MD as Consulting Physician (Pulmonary Disease)  Chief Complaint  Patient presents with   Follow-up    DOE. No wheezing or cough. Sleeps better with CPAP.    HPI Kimberly Rivas is a 57 year old lifelong never smoker who presents for follow-up on the issue of shortness of breath and cough.  This is a scheduled visit.  She was last seen on 10 July 2022, this is a scheduled visit.  Cough has been noted to be secondary to moderate persistent asthma, it is now resolved on Trelegy.  She has not required rescue albuterol dosing since her last visit. She has not had any fevers, chills or sweats. Overall she feels that she is improving.  She has dyspnea on exertion that is at baseline and this is likely multifactorial, suspect that there is an significant element of obesity and deconditioning.  Esophageal reflux symptoms are better controlled.  She did have a sleep study performed on 06 March 2022 that was consistent with mild to moderate obstructive sleep apnea with AHI of 14.  She is currently on CPAP at 5 to 15 cm H2O.  She has been very compliant with the CPAP therapy compliance averaging 83% .  Residual AHI is 2.4.  CPAP pressures fluctuate between 7.8 and 7.4.  She notes more restorative sleep with the device.  She has had however some issues with her mask fitting.  We have ordered a different type mask for her, and a DreamWear silicone pillows mask.  She does not endorse any other symptomatology today.  Overall she feels well and looks well.   Requests flu vaccine today.  TEST/EVENTS :  Sleep study 2016 WNL ONO wnl 2017 06/05/2020 PFTs: ratio 84% WNL, Fev1 84% FVC 79% wnl Abs eos 04/13/15; 400 CT chest 08/05/2017: Stable right upper lobe  nodule/scar no other abnormalities COVID-19 test April 06, 2019 positive 06/05/2020 alpha 1: Phenotype MM, level 148 mg/dL 46/96/2952 allergen panel: Negative, IgE 12 02/13/2022 PFT: FEV1 89% FVC 80%, FEV1/FVC 86% diffusion capacity corrects for alveolar volume 03/06/2022 HST: Mild to moderate obstructive sleep apnea with AHI of 14 and SpO2 nadir of 84%  Review of Systems A 10 point review of systems was performed and it is as noted above otherwise negative.   Patient Active Problem List   Diagnosis Date Noted   Encounter for screening colonoscopy 04/14/2022   Polyp of colon 04/14/2022   Endometrial polyp    COVID-19 virus infection 04/22/2019   Cough 04/22/2019   Hypothyroid    Chest tightness    Obese    Chest pressure 02/16/2015   Palpitations 02/16/2015    Social History   Tobacco Use   Smoking status: Never   Smokeless tobacco: Never  Substance Use Topics   Alcohol use: Not Currently    Allergies  Allergen Reactions   Hydrocodone Nausea Only   Tramadol Nausea And Vomiting    Current Meds  Medication Sig   albuterol (PROAIR HFA) 108 (90 Base) MCG/ACT inhaler Inhale 1-2 puffs into the lungs every 6 (six) hours as needed for wheezing or shortness of breath.   ALPRAZolam (XANAX) 0.25 MG tablet Take 0.125-0.25 mg by mouth daily as needed for anxiety.   aspirin EC 81 MG tablet Take 81  mg by mouth daily.   cetirizine (ZYRTEC) 10 MG tablet Take 10 mg by mouth daily as needed for allergies.   cyclobenzaprine (FLEXERIL) 10 MG tablet Take 1 tablet (10 mg total) by mouth 3 (three) times daily as needed for muscle spasms.   famotidine (PEPCID) 20 MG tablet Take 20 mg by mouth daily as needed for heartburn or indigestion.   Fluticasone-Umeclidin-Vilant (TRELEGY ELLIPTA) 200-62.5-25 MCG/ACT AEPB INHALE 1 PUFF ONCE DAILY   hydrochlorothiazide (HYDRODIURIL) 25 MG tablet Take 25 mg by mouth daily.   ibuprofen (ADVIL) 600 MG tablet Take 1 tablet (600 mg total) by mouth every 8  (eight) hours as needed for mild pain or cramping.   levothyroxine (SYNTHROID) 150 MCG tablet Take 150 mcg by mouth every morning.   losartan (COZAAR) 50 MG tablet Take 50 mg by mouth daily.   propranolol (INDERAL) 20 MG tablet Take 20 mg by mouth 3 (three) times daily.   rosuvastatin (CRESTOR) 10 MG tablet Take 10 mg by mouth daily.   sertraline (ZOLOFT) 25 MG tablet Take 25 mg by mouth daily.    Immunization History  Administered Date(s) Administered   Influenza,inj,Quad PF,6+ Mos 02/13/2022   Influenza-Unspecified 01/20/2019   PFIZER(Purple Top)SARS-COV-2 Vaccination 07/07/2019, 08/02/2019      Objective:     BP 124/84 (BP Location: Right Arm, Cuff Size: Normal)   Pulse 66   Temp 98.2 F (36.8 C)   Ht 5\' 3"  (1.6 m)   Wt 213 lb (96.6 kg)   SpO2 95%   BMI 37.73 kg/m   SpO2: 95 % O2 Device: None (Room air)  GENERAL: Obese woman, no acute distress, no conversational dyspnea.  HEAD: Normocephalic, atraumatic.  EYES: Pupils equal, round, reactive to light.  No scleral icterus.  Mild periorbital puffiness (allergic shiners). MOUTH: Teeth intact, oral mucosa moist. NECK: Supple. No thyromegaly. Trachea midline. No JVD.  No adenopathy. PULMONARY: Good air entry bilaterally.  Coarse otherwise, no adventitious sounds. CARDIOVASCULAR: S1 and S2. Regular rate and rhythm.  No rubs, murmurs or gallops heard. ABDOMEN: Bees otherwise benign. MUSCULOSKELETAL: No joint deformity, no clubbing, no edema.  NEUROLOGIC: No focal deficit, speech is fluent, no gait disturbance. SKIN: Intact,warm,dry.  On limited exam no rashes. PSYCH: Mood and behavior is normal.   Assessment & Plan:     ICD-10-CM   1. Moderate persistent asthma without complication  J45.40    Very well-controlled on Trelegy Ellipta 200 Continue as needed albuterol    2. SOB (shortness of breath)  R06.02    Mostly on exertion Suspect element of obesity/deconditioning No worsening    3. OSA (obstructive sleep apnea)   G47.33    Compliant with therapy Needs new mask Ordered silicone pillows DreamWear mask    4. Obesity, Class II, BMI 35-39.9, isolated (see actual BMI)  Z61.096    This issue adds complexity to her management She would benefit from weight loss    5. Need for immunization against influenza  Z23 Flu vaccine trivalent PF, 6mos and older(Flulaval,Afluria,Fluarix,Fluzone)    Ambulatory Referral for DME   She received flu vaccine today     Orders Placed This Encounter  Procedures   Flu vaccine trivalent PF, 6mos and older(Flulaval,Afluria,Fluarix,Fluzone)   Ambulatory Referral for DME    Referral Priority:   Routine    Referral Type:   Durable Medical Equipment Purchase    Number of Visits Requested:   1   Will see the patient in follow-up in 3 to 4 months time she is  to contact us prior to that time for any new difficulties arise.   Gailen Shelter, MD Advanced Bronchoscopy [facility]    *This note was dictated using voice recognition software/Dragon.  Despite best efforts to proofread, errors can occur which can change the meaning. Any transcriptional errors that result from this process are unintentional and may not be fully corrected at the time of dictation.

## 2023-01-16 ENCOUNTER — Encounter: Payer: Self-pay | Admitting: Pulmonary Disease

## 2023-02-11 ENCOUNTER — Encounter: Payer: Self-pay | Admitting: Obstetrics

## 2023-02-11 ENCOUNTER — Ambulatory Visit (INDEPENDENT_AMBULATORY_CARE_PROVIDER_SITE_OTHER): Payer: No Typology Code available for payment source | Admitting: Obstetrics

## 2023-02-11 ENCOUNTER — Other Ambulatory Visit: Payer: Self-pay | Admitting: Obstetrics

## 2023-02-11 ENCOUNTER — Other Ambulatory Visit (HOSPITAL_COMMUNITY)
Admission: RE | Admit: 2023-02-11 | Discharge: 2023-02-11 | Disposition: A | Discharge status: Home / Self Care | Payer: No Typology Code available for payment source | Source: Ambulatory Visit | Attending: Obstetrics | Admitting: Obstetrics

## 2023-02-11 VITALS — BP 130/80 | HR 74 | Ht 63.0 in | Wt 213.0 lb

## 2023-02-11 DIAGNOSIS — Z23 Encounter for immunization: Secondary | ICD-10-CM

## 2023-02-11 DIAGNOSIS — Z01419 Encounter for gynecological examination (general) (routine) without abnormal findings: Secondary | ICD-10-CM | POA: Diagnosis not present

## 2023-02-11 DIAGNOSIS — Z1159 Encounter for screening for other viral diseases: Secondary | ICD-10-CM

## 2023-02-11 DIAGNOSIS — Z124 Encounter for screening for malignant neoplasm of cervix: Secondary | ICD-10-CM

## 2023-02-11 NOTE — Progress Notes (Signed)
GYNECOLOGY: ANNUAL EXAM   Subjective:    PCP: Sherrie Mustache, MD Laurana Magistro Drawdy is a 57 y.o. female [phone] who presents for annual wellness visit.   Well Woman Visit:  GYN HISTORY:  No LMP recorded. Patient is postmenopausal.     Menstrual History: OB History     Gravida  4   Para  3   Term  3   Preterm      AB  1   Living  3      SAB  1   IAB      Ectopic      Multiple      Live Births  51           Menarche age: 8 No LMP recorded. Patient is postmenopausal.   Post-menopausal bleeding? no Urinary incontinence? yes Sexually active: yes Number of sexual partners: 1 Gender of sexual Partners: male  Social History   Substance and Sexual Activity  Sexual Activity Yes   Birth control/protection: Post-menopausal   Dyspareunia? Yes  STI history: No  STI/HIV testing or immunizations needed? No.   Health Maintenance: -Last pap: was normal last year at pcp --> Any abnormals: Yes  -Last mammogram: 05/08/22 --> Any abnormals? NO  -Last colon cancer screen: 04/14/22 / Type: Colonoscopy    -Last DEXA scan: never -[facility] Fall of Breast / Colon / Cervical cancer:Maternal Cousin & Paternal Aunt -Ovarian cancer  -Vaccines:  Immunization History  Administered Date(s) Administered   Influenza, Seasonal, Injecte, Preservative Fre 01/15/2023   Influenza,inj,Quad PF,6+ Mos 02/13/2022   Influenza-Unspecified 01/20/2019   PFIZER(Purple Top)SARS-COV-2 Vaccination 07/07/2019, 08/02/2019   Tdap 02/11/2023   Last Tdap: Needs / Flu: Completed / COVID: Completed / Gardasil: aged out / Shingles (50+): Needs / PCV20:  -Hep C screen: not sure -Last lipid / glucose screening: Yes pcp   > Exercise: bicycling, moderately active > Dietary Supplements: Folate: No;  Calcium: No}; Vitamin D: Yes > Body mass index is 37.73 kg/m.  > Recent dental visit Yes.   > Seat Belt Use: Yes.   > Texting and driving? No. > Guns in the house Yes.   > Recreational or other drug use:  denied.   Social History   Tobacco Use   Smoking status: Never   Smokeless tobacco: Never  Substance Use Topics   Alcohol use: Not Currently   Occupation: csr     Lives with: Husband     PHQ-2 Score: In last two weeks, how often have you felt: Little interest or pleasure in doing things: Not at all (0) Feeling down, depressed or hopeless: Not at all (0) Score: 0  GAD-2 Over the last 2 weeks, how often have you been bothered by the following problems? Feeling nervous, anxious or on edge: Not at all (0) Not being able to stop or control worrying: Not at all (0)} Score: 0 _________________________________________________________  Current Outpatient Medications  Medication Sig Dispense Refill   albuterol (PROAIR HFA) 108 (90 Base) MCG/ACT inhaler Inhale 1-2 puffs into the lungs every 6 (six) hours as needed for wheezing or shortness of breath. 1 each 5   ALPRAZolam (XANAX) 0.25 MG tablet Take 0.125-0.25 mg by mouth daily as needed for anxiety.     aspirin EC 81 MG tablet Take 81 mg by mouth daily.     cetirizine (ZYRTEC) 10 MG tablet Take 10 mg by mouth daily as needed for allergies.     cyclobenzaprine (FLEXERIL) 10 MG tablet Take 1 tablet (10  mg total) by mouth 3 (three) times daily as needed for muscle spasms. 30 tablet 0   famotidine (PEPCID) 20 MG tablet Take 20 mg by mouth daily as needed for heartburn or indigestion.     Fluticasone-Umeclidin-Vilant (TRELEGY ELLIPTA) 200-62.5-25 MCG/ACT AEPB INHALE 1 PUFF ONCE DAILY 60 each 6   hydrochlorothiazide (HYDRODIURIL) 25 MG tablet Take 25 mg by mouth daily.     ibuprofen (ADVIL) 600 MG tablet Take 1 tablet (600 mg total) by mouth every 8 (eight) hours as needed for mild pain or cramping. 30 tablet 0   levothyroxine (SYNTHROID) 150 MCG tablet Take 150 mcg by mouth every morning.     losartan (COZAAR) 50 MG tablet Take 50 mg by mouth daily.     methylPREDNISolone (MEDROL DOSEPAK) 4 MG TBPK tablet Use as directed 21 each 0    propranolol (INDERAL) 20 MG tablet Take 20 mg by mouth 3 (three) times daily.     rosuvastatin (CRESTOR) 10 MG tablet Take 10 mg by mouth daily.     sertraline (ZOLOFT) 25 MG tablet Take 25 mg by mouth daily.     No current facility-administered medications for this visit.   Allergies  Allergen Reactions   Hydrocodone Nausea Only   Tramadol Nausea And Vomiting    Past Medical History:  Diagnosis Date   Anemia    H/O   Anxiety    Asthma    Atypical chest pain    Dysrhythmia    GERD (gastroesophageal reflux disease)    HLD (hyperlipidemia)    Hypertension    Hypothyroid    a. dx'd at age 66   Obese    Palpitations    a. 48 hr Holter 02/2015: NSR, rare PACs and PVCs. Otherwise, no significant arrhythmia    Shortness of breath dyspnea    RELATED TO ANXIETY PER PT-PT STATES SHE CAN WALK A MILE WITHOUT GETTING SOB (05-09-15)PT IS SEEING DR Belia Heman FOR A PULMONARY NODULE SEEN ON CT   Past Surgical History:  Procedure Laterality Date   BREAST REDUCTION SURGERY     CESAREAN SECTION     X3   COLONOSCOPY     COLONOSCOPY WITH PROPOFOL N/A 04/14/2022   Procedure: COLONOSCOPY WITH PROPOFOL;  Surgeon: Toney Reil, MD;  Location: [facility];  Service: Gastroenterology;  Laterality: N/A;   DILATION AND CURETTAGE OF UTERUS     HYSTEROSCOPY WITH D & C N/A 01/31/2021   Procedure: DILATATION AND CURETTAGE /HYSTEROSCOPY WITH POLYPECTOMY;  Surgeon: Vena Austria, MD;  Location: [facility];  Service: Gynecology;  Laterality: N/A;   KNEE ARTHROSCOPY Left 05/10/2015   Procedure: ARTHROSCOPY KNEE, partial medial meniscectomy;  Surgeon: Kennedy Bucker, MD;  Location: [facility];  Service: Orthopedics;  Laterality: Left;   REDUCTION MAMMAPLASTY Bilateral 04/07/1996    Review Of Systems  Constitutional: Denied constitutional symptoms, night sweats, recent illness, fatigue, fever, insomnia and weight loss.  Eyes: Denied eye symptoms, eye pain, photophobia, vision change and visual disturbance.   Ears/Nose/Throat/Neck: Denied ear, nose, throat or neck symptoms, hearing loss, nasal discharge, sinus congestion and sore throat.  Cardiovascular: Denied cardiovascular symptoms, arrhythmia, chest pain/pressure, edema, exercise intolerance, orthopnea and palpitations.  Respiratory: Denied pulmonary symptoms, asthma, pleuritic pain, productive sputum, cough, dyspnea and wheezing.  Gastrointestinal: Denied, gastro-esophageal reflux, melena, nausea and vomiting.  Genitourinary: Denied genitourinary symptoms including symptomatic vaginal discharge, pelvic relaxation issues, and urinary complaints.  Musculoskeletal: Denied musculoskeletal symptoms, stiffness, swelling, muscle weakness and myalgia.  Dermatologic: Denied dermatology symptoms, rash and scar.  Neurologic: Denied neurology symptoms, dizziness, headache, neck pain and syncope.  Psychiatric: Denied psychiatric symptoms, anxiety and depression.  Endocrine: Denied endocrine symptoms including hot flashes and night sweats.      Objective:    BP 130/80   Pulse 74   Ht 5\' 3"  (1.6 m)   Wt 213 lb (96.6 kg)   BMI 37.73 kg/m   Constitutional: Well-developed, well-nourished female in no acute distress Neurological: Alert and oriented to person, place, and time Psychiatric: Mood and affect appropriate Skin: No rashes or lesions Neck: Supple without masses. Trachea is midline.Thyroid is normal size without masses Lymphatics: No cervical, axillary, supraclavicular, or inguinal adenopathy noted Respiratory: Clear to auscultation bilaterally. Good air movement with normal work of breathing. Cardiovascular: Regular rate and rhythm. Extremities grossly normal, nontender with no edema; pulses regular Gastrointestinal: Soft, nontender, nondistended. No masses or hernias appreciated. No hepatosplenomegaly. No fluid wave. No rebound or guarding. Breast Exam: normal appearance, no masses or tenderness, Inspection negative, No nipple retraction or  dimpling, No nipple discharge or bleeding, No axillary or supraclavicular adenopathy Genitourinary:         External Genitalia: Normal female genitalia    Vagina: Dry mucosa, no lesions. Mild atrophic tissues.     Cervix: No lesions, normal size and consistency; no cervical motion tenderness; non-friable; Pap obtained.    Uterus: Normal size and contour; smooth, mobile, NT, retroverted. Adnexae: Non-palpable and non-tender Perineum/Anus: No lesions Rectal: deferred    Assessment/Plan:    ALAISHA EVERSLEY is a 57 y.o. female [phone] with normal well-woman gynecologic exam.  -Screenings:  Pap: done with cotesting today   Mammogram: completed w/PCP Feb of this year Colon: managed by PCP, UTD  Labs: per PCP GAD/PHQ-2 = 0 -Contraception: post-menopausal -Vaccines: Tdap today; UTD on Influenza and COVID. Recommend Shingrix and PCV -Healthy lifestyle modifications discussed: multivitamin, diet, exercise, sunscreen, tobacco and alcohol use. Emphasized importance of regular physical activity.  -Calcium and Vit D recommendation reviewed.  -All questions answered to patient's satisfaction.  -RTC 1 yr for annual, sooner prn.     Julieanne Manson, DO [facility]

## 2023-02-12 LAB — HEPATITIS C ANTIBODY: Hep C Virus Ab: NONREACTIVE

## 2023-02-16 LAB — CYTOLOGY - PAP
Comment: NEGATIVE
Diagnosis: NEGATIVE
High risk HPV: NEGATIVE

## 2023-02-26 ENCOUNTER — Other Ambulatory Visit: Payer: Self-pay | Admitting: Internal Medicine

## 2023-02-26 DIAGNOSIS — Z1231 Encounter for screening mammogram for malignant neoplasm of breast: Secondary | ICD-10-CM

## 2023-05-12 ENCOUNTER — Ambulatory Visit
Admission: RE | Admit: 2023-05-12 | Discharge: 2023-05-12 | Disposition: A | Discharge status: Home / Self Care | Payer: No Typology Code available for payment source | Source: Ambulatory Visit | Attending: Internal Medicine | Admitting: Internal Medicine

## 2023-05-12 DIAGNOSIS — Z1231 Encounter for screening mammogram for malignant neoplasm of breast: Secondary | ICD-10-CM | POA: Diagnosis present

## 2023-05-15 ENCOUNTER — Encounter: Payer: Self-pay | Admitting: Internal Medicine

## 2023-05-18 ENCOUNTER — Other Ambulatory Visit: Payer: Self-pay | Admitting: Internal Medicine

## 2023-05-18 DIAGNOSIS — R928 Other abnormal and inconclusive findings on diagnostic imaging of breast: Secondary | ICD-10-CM

## 2023-06-04 ENCOUNTER — Ambulatory Visit
Admission: RE | Admit: 2023-06-04 | Discharge: 2023-06-04 | Disposition: A | Discharge status: Home / Self Care | Payer: No Typology Code available for payment source | Source: Ambulatory Visit | Attending: Internal Medicine | Admitting: Internal Medicine

## 2023-06-04 DIAGNOSIS — R928 Other abnormal and inconclusive findings on diagnostic imaging of breast: Secondary | ICD-10-CM | POA: Insufficient documentation

## 2023-06-05 ENCOUNTER — Other Ambulatory Visit: Payer: Self-pay | Admitting: Internal Medicine

## 2023-06-05 ENCOUNTER — Encounter: Payer: Self-pay | Admitting: Internal Medicine

## 2023-06-05 DIAGNOSIS — N63 Unspecified lump in unspecified breast: Secondary | ICD-10-CM

## 2023-06-05 DIAGNOSIS — R928 Other abnormal and inconclusive findings on diagnostic imaging of breast: Secondary | ICD-10-CM

## 2023-06-08 ENCOUNTER — Other Ambulatory Visit: Payer: Self-pay | Admitting: Internal Medicine

## 2023-06-08 DIAGNOSIS — R928 Other abnormal and inconclusive findings on diagnostic imaging of breast: Secondary | ICD-10-CM

## 2023-06-16 ENCOUNTER — Ambulatory Visit
Admission: RE | Admit: 2023-06-16 | Discharge: 2023-06-16 | Disposition: A | Discharge status: Home / Self Care | Source: Ambulatory Visit | Attending: Internal Medicine | Admitting: Internal Medicine

## 2023-06-16 ENCOUNTER — Other Ambulatory Visit: Payer: Self-pay | Admitting: Internal Medicine

## 2023-06-16 DIAGNOSIS — N63 Unspecified lump in unspecified breast: Secondary | ICD-10-CM | POA: Insufficient documentation

## 2023-06-16 DIAGNOSIS — R928 Other abnormal and inconclusive findings on diagnostic imaging of breast: Secondary | ICD-10-CM

## 2023-06-16 DIAGNOSIS — D241 Benign neoplasm of right breast: Secondary | ICD-10-CM | POA: Insufficient documentation

## 2023-06-16 DIAGNOSIS — N62 Hypertrophy of breast: Secondary | ICD-10-CM | POA: Insufficient documentation

## 2023-06-16 MED ORDER — LIDOCAINE 1 % OPTIME INJ - NO CHARGE
2.0000 mL | Freq: Once | INTRAMUSCULAR | Status: AC
  Administered 2023-06-16: 2 mL
  Filled 2023-06-16: qty 2

## 2023-06-16 MED ORDER — LIDOCAINE-EPINEPHRINE 1 %-1:100000 IJ SOLN
8.0000 mL | Freq: Once | INTRAMUSCULAR | Status: AC
  Administered 2023-06-16: 8 mL
  Filled 2023-06-16: qty 8

## 2023-06-17 LAB — SURGICAL PATHOLOGY

## 2023-07-27 ENCOUNTER — Other Ambulatory Visit: Payer: Self-pay | Admitting: Pulmonary Disease

## 2023-07-27 NOTE — Telephone Encounter (Signed)
 Pt needs appt for refill, last seen 6 months ago. Can refill once appt is scheduled.

## 2023-08-11 ENCOUNTER — Other Ambulatory Visit: Payer: Self-pay | Admitting: Pulmonary Disease

## 2023-09-07 ENCOUNTER — Other Ambulatory Visit: Payer: Self-pay | Admitting: Pulmonary Disease

## 2023-09-07 DIAGNOSIS — R0602 Shortness of breath: Secondary | ICD-10-CM

## 2023-09-07 DIAGNOSIS — J4540 Moderate persistent asthma, uncomplicated: Secondary | ICD-10-CM

## 2023-10-01 ENCOUNTER — Other Ambulatory Visit: Payer: Self-pay | Admitting: Pulmonary Disease

## 2023-10-01 DIAGNOSIS — J4540 Moderate persistent asthma, uncomplicated: Secondary | ICD-10-CM

## 2023-10-01 DIAGNOSIS — R0602 Shortness of breath: Secondary | ICD-10-CM

## 2023-10-23 ENCOUNTER — Other Ambulatory Visit: Payer: Self-pay | Admitting: Obstetrics

## 2023-10-23 DIAGNOSIS — N63 Unspecified lump in unspecified breast: Secondary | ICD-10-CM

## 2023-10-29 ENCOUNTER — Ambulatory Visit: Admitting: Pulmonary Disease

## 2023-11-13 ENCOUNTER — Encounter: Payer: Self-pay | Admitting: Internal Medicine

## 2023-11-30 ENCOUNTER — Encounter: Payer: Self-pay | Admitting: Cardiology

## 2023-11-30 ENCOUNTER — Ambulatory Visit (INDEPENDENT_AMBULATORY_CARE_PROVIDER_SITE_OTHER): Admitting: Cardiology

## 2023-11-30 VITALS — BP 110/78 | HR 81 | Ht 63.0 in | Wt 210.0 lb

## 2023-11-30 DIAGNOSIS — Z7689 Persons encountering health services in other specified circumstances: Secondary | ICD-10-CM

## 2023-11-30 DIAGNOSIS — I10 Essential (primary) hypertension: Secondary | ICD-10-CM | POA: Insufficient documentation

## 2023-11-30 DIAGNOSIS — Z713 Dietary counseling and surveillance: Secondary | ICD-10-CM

## 2023-11-30 DIAGNOSIS — E039 Hypothyroidism, unspecified: Secondary | ICD-10-CM | POA: Diagnosis not present

## 2023-11-30 MED ORDER — WEGOVY 0.25 MG/0.5ML ~~LOC~~ SOAJ: 0.2500 mg | SUBCUTANEOUS | 3 refills | Status: AC

## 2023-11-30 MED ORDER — LOSARTAN POTASSIUM 50 MG PO TABS: 50.0000 mg | ORAL_TABLET | Freq: Every day | ORAL | 1 refills | Status: AC

## 2023-11-30 NOTE — Progress Notes (Signed)
 New Patient Office Visit  Subjective   Patient ID: Kimberly Rivas, female    DOB: 05-02-1965  Age: 58 y.o. MRN: [id number]  CC:  Chief Complaint  Patient presents with   New Patient (Initial Visit)    New Patient     HPI Kimberly Rivas presents to establish care Previous Primary Care provider/office:   she does not have additional concerns to discuss today.   Patient in office to establish care. Patient doing well, no complaints today. Had blood work done July 2025, normal Hgb A1c, LDL at goal. Patient requesting Wegovy  for weight loss. Patient has sleep apnea, using and benefiting from CPAP machine.  Discussed PHQ9 and GAD7 scores. Patient on Zoloft, feels the dose is adequate enough.     Outpatient Encounter Medications as of 11/30/2023  Medication Sig   albuterol  (PROAIR  HFA) 108 (90 Base) MCG/ACT inhaler Inhale 1-2 puffs into the lungs every 6 (six) hours as needed for wheezing or shortness of breath.   aspirin EC 81 MG tablet Take 81 mg by mouth daily.   cetirizine (ZYRTEC) 10 MG tablet Take 10 mg by mouth daily as needed for allergies.   docusate sodium (COLACE) 100 MG capsule Take 1 capsule twice a day by oral route.   famotidine  (PEPCID ) 20 MG tablet Take 20 mg by mouth daily as needed for heartburn or indigestion.   Fluticasone -Umeclidin-Vilant (TRELEGY ELLIPTA ) 200-62.5-25 MCG/ACT AEPB INHALE 1 PUFF ONCE DAILY IN THE AFTERNOON. (PLEASE SCHEDULE OFFICE VISIT FOR FURTHER REFILLS)   hydrochlorothiazide  (HYDRODIURIL ) 25 MG tablet Take 25 mg by mouth daily.   ibuprofen  (ADVIL ) 600 MG tablet Take 1 tablet (600 mg total) by mouth every 8 (eight) hours as needed for mild pain or cramping.   levothyroxine (SYNTHROID) 150 MCG tablet Take 150 mcg by mouth every morning.   ondansetron  (ZOFRAN -ODT) 8 MG disintegrating tablet DISSOLVE 1 TABLET IN MOUTH TWICE DAILY AS NEEDED (Patient taking differently: DISSOLVE 1 TABLET IN MOUTH TWICE DAILY AS NEEDED)   propranolol (INDERAL) 20  MG tablet Take 20 mg by mouth 3 (three) times daily.   rosuvastatin  (CRESTOR ) 10 MG tablet Take 10 mg by mouth daily.   semaglutide -weight management (WEGOVY ) 0.25 MG/0.5ML SOAJ SQ injection Inject 0.25 mg into the skin once a week.   sertraline (ZOLOFT) 25 MG tablet Take 25 mg by mouth daily.   [DISCONTINUED] losartan  (COZAAR ) 50 MG tablet Take 50 mg by mouth daily.   HYDROcodone -acetaminophen  (NORCO/VICODIN) 5-325 MG tablet Take 1 tablet by mouth every 4 (four) hours as needed.   losartan  (COZAAR ) 50 MG tablet Take 1 tablet (50 mg total) by mouth daily.   [DISCONTINUED] ALPRAZolam (XANAX) 0.25 MG tablet Take 0.125-0.25 mg by mouth daily as needed for anxiety.   [DISCONTINUED] cyclobenzaprine  (FLEXERIL ) 10 MG tablet Take 1 tablet (10 mg total) by mouth 3 (three) times daily as needed for muscle spasms.   [DISCONTINUED] methylPREDNISolone  (MEDROL  DOSEPAK) 4 MG TBPK tablet Use as directed   No facility-administered encounter medications on file as of 11/30/2023.    Past Medical History:  Diagnosis Date   Anemia    H/O   Anxiety    Asthma    Atypical chest pain    Dysrhythmia    GERD (gastroesophageal reflux disease)    HLD (hyperlipidemia)    Hypertension    Hypothyroid    a. dx'd at age 35   Obese    Palpitations    a. 48 hr Holter 02/2015: NSR, rare PACs and PVCs.  Otherwise, no significant arrhythmia    Shortness of breath dyspnea    RELATED TO ANXIETY PER PT-PT STATES SHE CAN WALK A MILE WITHOUT GETTING SOB (05-09-15)PT IS SEEING DR KASA FOR A PULMONARY NODULE SEEN ON CT    Past Surgical History:  Procedure Laterality Date   BREAST BIOPSY Right 06/16/2023   US  RT BREAST BX W LOC DEV 1ST LESION IMG BX SPEC US  GUIDE 06/16/2023 [facility]   BREAST BIOPSY Right 06/16/2023   US  RT BREAST BX W LOC DEV EA ADD LESION IMG BX SPEC US  GUIDE 06/16/2023 [facility]   BREAST REDUCTION SURGERY     CESAREAN SECTION     X3   COLONOSCOPY     COLONOSCOPY WITH PROPOFOL  N/A 04/14/2022    Procedure: COLONOSCOPY WITH PROPOFOL ;  Surgeon: Unk Corinn Skiff, MD;  Location: [facility];  Service: Gastroenterology;  Laterality: N/A;   DILATION AND CURETTAGE OF UTERUS     HYSTEROSCOPY WITH D & C N/A 01/31/2021   Procedure: DILATATION AND CURETTAGE /HYSTEROSCOPY WITH POLYPECTOMY;  Surgeon: Lake Read, MD;  Location: [facility];  Service: Gynecology;  Laterality: N/A;   KNEE ARTHROSCOPY Left 05/10/2015   Procedure: ARTHROSCOPY KNEE, partial medial meniscectomy;  Surgeon: Ozell Flake, MD;  Location: [facility];  Service: Orthopedics;  Laterality: Left;   REDUCTION MAMMAPLASTY Bilateral 04/07/1996    Family History  Problem Relation Age of Onset   Acute myelogenous leukemia Mother    COPD Mother    Heart Problems Mother    Hypertension Mother    Hypothyroidism Mother    Stroke Mother    Stroke Father    Epilepsy Brother    Ovarian cancer Paternal Aunt        30s   Heart Problems Paternal Grandmother    Heart Problems Paternal Grandfather    Breast cancer Neg Hx     Social History   Socioeconomic History   Marital status: Married    Spouse name: Not on file   Number of children: Not on file   Years of education: Not on file   Highest education level: Not on file  Occupational History   Not on file  Tobacco Use   Smoking status: Never   Smokeless tobacco: Never  Vaping Use   Vaping status: Never Used  Substance and Sexual Activity   Alcohol use: Not Currently   Drug use: No   Sexual activity: Yes    Birth control/protection: Post-menopausal  Other Topics Concern   Not on file  Social History Narrative   Not on file   Social Drivers of Health   Financial Resource Strain: Low Risk  (02/19/2023)   Received from [facility]   Overall Financial Resource Strain (CARDIA)    Difficulty of Paying Living Expenses: Not hard at all  Food Insecurity: No Food Insecurity (02/19/2023)   Received from [facility]   Hunger  Vital Sign    Within the past 12 months, you worried that your food would run out before you got the money to buy more.: Never true    Within the past 12 months, the food you bought just didn't last and you didn't have money to get more.: Never true  Transportation Needs: No Transportation Needs (02/19/2023)   Received from [facility] - Transportation    In the past 12 months, has lack of transportation kept you from medical appointments or from getting medications?: No    Lack of Transportation (Non-Medical):  No  Physical Activity: Not on file  Stress: Not on file  Social Connections: Not on file  Intimate Partner Violence: Not on file    Review of Systems  Constitutional: Negative.   HENT: Negative.    Eyes: Negative.   Respiratory: Negative.  Negative for shortness of breath.   Cardiovascular: Negative.  Negative for chest pain.  Gastrointestinal: Negative.  Negative for abdominal pain, constipation and diarrhea.  Genitourinary: Negative.   Musculoskeletal:  Negative for joint pain and myalgias.  Skin: Negative.   Neurological: Negative.  Negative for dizziness and headaches.  Endo/Heme/Allergies: Negative.   All other systems reviewed and are negative.       Objective   BP 110/78   Pulse 81   Ht 5' 3 (1.6 m)   Wt 210 lb (95.3 kg)   SpO2 93%   BMI 37.20 kg/m   Physical Exam Vitals and nursing note reviewed.  Constitutional:      Appearance: Normal appearance. She is normal weight.  HENT:     Head: Normocephalic and atraumatic.     Nose: Nose normal.     Mouth/Throat:     Mouth: Mucous membranes are moist.  Eyes:     Extraocular Movements: Extraocular movements intact.     Conjunctiva/sclera: Conjunctivae normal.     Pupils: Pupils are equal, round, and reactive to light.  Cardiovascular:     Rate and Rhythm: Normal rate and regular rhythm.     Pulses: Normal pulses.     Heart sounds: Normal heart sounds.  Pulmonary:      Effort: Pulmonary effort is normal.     Breath sounds: Normal breath sounds.  Abdominal:     General: Abdomen is flat. Bowel sounds are normal.     Palpations: Abdomen is soft.  Musculoskeletal:        General: Normal range of motion.     Cervical back: Normal range of motion.  Skin:    General: Skin is warm and dry.  Neurological:     General: No focal deficit present.     Mental Status: She is alert and oriented to person, place, and time.  Psychiatric:        Mood and Affect: Mood normal.        Behavior: Behavior normal.        Thought Content: Thought content normal.        Judgment: Judgment normal.    Flowsheet Row Office Visit from 11/30/2023 in [facility]  Thoughts that you would be better off dead, or of hurting yourself in some way Not at all  PHQ-9 Total Score 4      11/30/2023    2:49 PM  GAD 7 : Generalized Anxiety Score  Nervous, Anxious, on Edge 0  Control/stop worrying 1  Worry too much - different things 1  Trouble relaxing 1  Restless 1  Easily annoyed or irritable 1  Afraid - awful might happen 1  Total GAD 7 Score 6        Assessment & Plan:  Wegovy  Continue same medications  Problem List Items Addressed This Visit       Cardiovascular and Mediastinum   Primary hypertension   Relevant Medications   losartan  (COZAAR ) 50 MG tablet     Endocrine   Hypothyroid - Primary     Other   Weight loss counseling, encounter for    Return in about 6 weeks (around 01/11/2024).   Total time spent: 25 minutes  Jeoffrey Pollen, NP  11/30/2023   This document may have been prepared by [facility] Voice Recognition software and as such may include unintentional dictation errors.

## 2023-12-10 ENCOUNTER — Ambulatory Visit: Admitting: Pulmonary Disease

## 2023-12-10 ENCOUNTER — Encounter: Payer: Self-pay | Admitting: Pulmonary Disease

## 2023-12-10 VITALS — BP 130/94 | HR 66 | Temp 97.6°F | Ht 63.0 in | Wt 209.8 lb

## 2023-12-10 DIAGNOSIS — J4540 Moderate persistent asthma, uncomplicated: Secondary | ICD-10-CM

## 2023-12-10 DIAGNOSIS — Z23 Encounter for immunization: Secondary | ICD-10-CM

## 2023-12-10 DIAGNOSIS — G4733 Obstructive sleep apnea (adult) (pediatric): Secondary | ICD-10-CM

## 2023-12-10 DIAGNOSIS — Z9889 Other specified postprocedural states: Secondary | ICD-10-CM | POA: Diagnosis not present

## 2023-12-10 DIAGNOSIS — E66812 Obesity, class 2: Secondary | ICD-10-CM

## 2023-12-10 NOTE — Patient Instructions (Signed)
 VISIT SUMMARY:  Kimberly Rivas, a 58 year old female, visited today due to issues with her CPAP machine for obstructive sleep apnea. She experiences discomfort and mask displacement during sleep, leading to sleep deprivation and vivid dreams. She is currently managing her persistent asthma well with Trelegy and is recovering from recent knee surgery with ongoing rehabilitation.  YOUR PLAN:  -OBSTRUCTIVE SLEEP APNEA WITH CPAP INTOLERANCE: Obstructive sleep apnea is a condition where the airway becomes blocked during sleep, causing breathing pauses. You are experiencing discomfort and mask displacement with your current CPAP machine, leading to poor sleep quality. We will refer you to a sleep specialist for a CPAP mask fitting and management to improve your comfort and sleep quality.  -PERSISTENT MODERATE ASTHMA: Asthma is a condition where your airways narrow and swell, producing extra mucus. Your asthma is currently well-managed with Trelegy, and no changes are needed at this time.  -POST-OPERATIVE STATUS FOLLOWING KNEE SURGERY: You are recovering from knee surgery and undergoing rehabilitation. While you have some challenging days, your overall progress is good. Continue with your rehabilitation as planned.  INSTRUCTIONS:  Please follow up with the sleep specialist for your CPAP mask fitting and management. Continue using Trelegy for your asthma as prescribed. Keep up with your rehabilitation exercises for your knee recovery.

## 2023-12-10 NOTE — Progress Notes (Signed)
 Subjective:    Patient ID: Kimberly Rivas, female    DOB: November 28, 1965, 58 y.o.   MRN: [id number]  Patient Care Team: Carin Gauze, NP as PCP - General (Cardiology) Darron Deatrice LABOR, MD as Consulting Physician (Cardiology) Tamea Dedra CROME, MD as Consulting Physician (Pulmonary Disease)  Chief Complaint  Patient presents with   Asthma    No breathing problems.    Obstructive Sleep Apnea    Patient reports having problems sleeping on her back due to recent surgery. Has not been using CPAP.     BACKGROUND/INTERVAL: 58 year old lifelong never smoker who presents for follow-up on the issue of moderate persistent asthma.  She also has obstructive sleep apnea.  Patient was last seen on 15 February 2023.  HPI Discussed the use of AI scribe software for clinical note transcription with the patient, who gave verbal consent to proceed.  History of Present Illness   Kimberly Rivas is a 58 year old female with obstructive sleep apnea who presents with issues using her CPAP machine.  She experiences discomfort with her current CPAP machine, which does not stay on her face during movement, leading to sleep deprivation and vivid dreams. She is seeking a different CPAP setup to improve her sleep quality.  She is currently using Trelegy for her persistent asthma and is doing well with it.  No recent use of rescue inhaler.  She recently underwent knee surgery and is currently undergoing rehabilitation. The rehabilitation is progressing well, although she has challenging days.  Because of her knee surgery she has had difficulty wearing her CPAP consistently.Reports sleep deprivation and 'really crazy dreams' due to issues with CPAP use.  Regarding vaccinations, she has only received the flu shot in the past. She is not regularly around children but mentions that her grandchildren are visiting currently.  No history of RSV.      TEST/EVENTS :  Sleep study 2016 WNL ONO wnl  2017 06/05/2020 PFTs: ratio 84% WNL, Fev1 84% FVC 79% wnl Abs eos 04/13/15; 400 CT chest 08/05/2017: Stable right upper lobe nodule/scar no other abnormalities COVID-19 test April 06, 2019 positive 06/05/2020 alpha 1: Phenotype MM, level 148 mg/dL 96/98/7977 allergen panel: Negative, IgE 12 02/13/2022 PFT: FEV1 89% FVC 80%, FEV1/FVC 86% diffusion capacity corrects for alveolar volume 03/06/2022 HST: Mild to moderate obstructive sleep apnea with AHI of 14 and SpO2 nadir of 84%    Review of Systems A 10 point review of systems was performed and it is as noted above otherwise negative.   Patient Active Problem List   Diagnosis Date Noted   Primary hypertension 11/30/2023   Weight loss counseling, encounter for 04/14/2022   Polyp of colon 04/14/2022   Endometrial polyp    COVID-19 virus infection 04/22/2019   Cough 04/22/2019   Hypothyroid    Chest tightness    Obese    Chest pressure 02/16/2015   Palpitations 02/16/2015    Social History   Tobacco Use   Smoking status: Never   Smokeless tobacco: Never  Substance Use Topics   Alcohol use: Not Currently    Allergies  Allergen Reactions   Hydrocodone  Nausea Only   Tramadol  Nausea And Vomiting    Current Meds  Medication Sig   albuterol  (PROAIR  HFA) 108 (90 Base) MCG/ACT inhaler Inhale 1-2 puffs into the lungs every 6 (six) hours as needed for wheezing or shortness of breath.   aspirin EC 81 MG tablet Take 81 mg by mouth daily.   cetirizine (  ZYRTEC) 10 MG tablet Take 10 mg by mouth daily as needed for allergies.   docusate sodium (COLACE) 100 MG capsule Take 1 capsule twice a day by oral route.   famotidine  (PEPCID ) 20 MG tablet Take 20 mg by mouth daily as needed for heartburn or indigestion.   Fluticasone -Umeclidin-Vilant (TRELEGY ELLIPTA ) 200-62.5-25 MCG/ACT AEPB INHALE 1 PUFF ONCE DAILY IN THE AFTERNOON. (PLEASE SCHEDULE OFFICE VISIT FOR FURTHER REFILLS)   hydrochlorothiazide  (HYDRODIURIL ) 25 MG tablet Take 25 mg  by mouth daily.   HYDROcodone -acetaminophen  (NORCO/VICODIN) 5-325 MG tablet Take 1 tablet by mouth every 4 (four) hours as needed.   ibuprofen  (ADVIL ) 600 MG tablet Take 1 tablet (600 mg total) by mouth every 8 (eight) hours as needed for mild pain or cramping.   levothyroxine (SYNTHROID) 150 MCG tablet Take 150 mcg by mouth every morning.   losartan  (COZAAR ) 50 MG tablet Take 1 tablet (50 mg total) by mouth daily.   ondansetron  (ZOFRAN -ODT) 8 MG disintegrating tablet DISSOLVE 1 TABLET IN MOUTH TWICE DAILY AS NEEDED (Patient taking differently: DISSOLVE 1 TABLET IN MOUTH TWICE DAILY AS NEEDED)   propranolol (INDERAL) 20 MG tablet Take 20 mg by mouth 3 (three) times daily.   rosuvastatin  (CRESTOR ) 10 MG tablet Take 10 mg by mouth daily.   semaglutide -weight management (WEGOVY ) 0.25 MG/0.5ML SOAJ SQ injection Inject 0.25 mg into the skin once a week.   sertraline (ZOLOFT) 25 MG tablet Take 25 mg by mouth daily.    Immunization History  Administered Date(s) Administered   Influenza, Seasonal, Injecte, Preservative Fre 01/15/2023   Influenza,inj,Quad PF,6+ Mos 02/13/2022   Influenza-Unspecified 01/20/2019   PFIZER(Purple Top)SARS-COV-2 Vaccination 07/07/2019, 08/02/2019   Tdap 02/11/2023        Objective:     BP (!) 130/94   Pulse 66   Temp 97.6 F (36.4 C) (Temporal)   Ht 5' 3 (1.6 m)   Wt 209 lb 12.8 oz (95.2 kg)   SpO2 95%   BMI 37.16 kg/m   SpO2: 95 %  GENERAL: Obese woman, no acute distress, no conversational dyspnea.  HEAD: Normocephalic, atraumatic.  EYES: Pupils equal, round, reactive to light.  No scleral icterus.  Mild periorbital puffiness (allergic shiners). MOUTH: Teeth intact, oral mucosa moist. NECK: Supple. No thyromegaly. Trachea midline. No JVD.  No adenopathy. PULMONARY: Good air entry bilaterally.  Coarse otherwise, no adventitious sounds. CARDIOVASCULAR: S1 and S2. Regular rate and rhythm.  No rubs, murmurs or gallops heard. ABDOMEN: Bees otherwise  benign. MUSCULOSKELETAL: No joint deformity, no clubbing, no edema.  NEUROLOGIC: No focal deficit, speech is fluent, no gait disturbance. SKIN: Intact,warm,dry.  On limited exam no rashes. PSYCH: Mood and behavior is normal.   Assessment & Plan:     ICD-10-CM   1. Moderate persistent asthma without complication  J45.40     2. OSA (obstructive sleep apnea) - Moderate  G47.33     3. Obesity, Class II, BMI 35-39.9, isolated (see actual BMI)  Z33.187     4. Immunization due  Z23 Flu vaccine trivalent PF, 6mos and older(Flulaval,Afluria,Fluarix,Fluzone)      Orders Placed This Encounter  Procedures   Flu vaccine trivalent PF, 6mos and older(Flulaval,Afluria,Fluarix,Fluzone)   Discussion:    Obstructive sleep apnea with CPAP intolerance Obstructive sleep apnea with difficulty tolerating CPAP due to discomfort and mask displacement during movement. Reports sleep deprivation and vivid dreams, which may be related to inadequate CPAP use. - Refer to sleep specialist, Dr. Jess, for CPAP mask fitting and management.  Persistent  moderate asthma Persistent moderate asthma currently managed with Trelegy.  Post-operative status following knee surgery Post-operative status following knee surgery with ongoing rehabilitation. Reports some days of discomfort but overall progressing well.      Advised if symptoms do not improve or worsen, to please contact office for sooner follow up or seek emergency care.    I spent 32 minutes of dedicated to the care of this patient on the date of this encounter to include pre-visit review of records, face-to-face time with the patient discussing conditions above, post visit ordering of testing, clinical documentation with the electronic health record, making appropriate referrals as documented, and communicating necessary findings to members of the patients care team.     C. Leita Sanders, MD [facility] [place]  [facility]    *This note was generated using voice recognition software/Dragon and/or AI transcription program.  Despite best efforts to proofread, errors can occur which can change the meaning. Any transcriptional errors that result from this process are unintentional and may not be fully corrected at the time of dictation.

## 2023-12-18 ENCOUNTER — Other Ambulatory Visit

## 2023-12-18 ENCOUNTER — Encounter

## 2023-12-21 ENCOUNTER — Ambulatory Visit: Admitting: Sleep Medicine

## 2023-12-22 ENCOUNTER — Ambulatory Visit
Admission: RE | Admit: 2023-12-22 | Discharge: 2023-12-22 | Disposition: A | Discharge status: Home / Self Care | Source: Ambulatory Visit | Attending: Obstetrics | Admitting: Obstetrics

## 2023-12-22 DIAGNOSIS — N63 Unspecified lump in unspecified breast: Secondary | ICD-10-CM | POA: Insufficient documentation

## 2023-12-23 ENCOUNTER — Ambulatory Visit (INDEPENDENT_AMBULATORY_CARE_PROVIDER_SITE_OTHER): Admitting: Sleep Medicine

## 2023-12-23 ENCOUNTER — Encounter: Payer: Self-pay | Admitting: Sleep Medicine

## 2023-12-23 VITALS — BP 134/90 | HR 64 | Temp 97.6°F | Ht 63.0 in | Wt 209.8 lb

## 2023-12-23 DIAGNOSIS — G4733 Obstructive sleep apnea (adult) (pediatric): Secondary | ICD-10-CM

## 2023-12-23 DIAGNOSIS — I10 Essential (primary) hypertension: Secondary | ICD-10-CM

## 2023-12-23 DIAGNOSIS — Z6837 Body mass index (BMI) 37.0-37.9, adult: Secondary | ICD-10-CM

## 2023-12-23 DIAGNOSIS — E669 Obesity, unspecified: Secondary | ICD-10-CM

## 2023-12-23 NOTE — Patient Instructions (Addendum)

## 2023-12-23 NOTE — Progress Notes (Signed)
 Name:Kimberly Rivas MRN: [id number] DOB: 07-Jun-1965   CHIEF COMPLAINT:  ESTABLISH CARE FOR OSA   HISTORY OF PRESENT ILLNESS: Kimberly Rivas is a 58 y.o. w/ a h/o OSA, asthma, HTN, hypothyroidism, GERD, hyperlipidemia, anxiety, depression and obesity who presents to establish care for OSA. Reports that she was initially diagnosed with OSA several years ago and was subsequently started on CPAP therapy. Reports difficulty using CPAP therapy due to mask discomfort. Reports that CPAP mask usually comes off during the night. Reports feeling more refreshed upon awakening with CPAP therapy. Reports nocturnal awakenings due to unclear reasons and occasionally has difficulty falling back to sleep. Denies any significant weight changes. Admits to night sweats. Denies morning headaches, RLS symptoms, dream enactment, cataplexy, hypnagogic or hypnapompic hallucinations. Reports a family history of sleep apnea. Denies drowsy driving. Drinks 1 cup of coffee and 1 soda daily, denies alcohol, tobacco or illicit drug use.   Bedtime 9 pm Sleep onset 10 mins Rise time 7 am   EPWORTH SLEEP SCORE 8    12/23/2023    3:22 PM 11/30/2023    2:00 PM 12/05/2021    3:00 PM  Results of the Epworth flowsheet  Sitting and reading 2 0 0  Watching TV 2 3 2   Sitting, inactive in a public place (e.g. a theatre or a meeting) 1 1 0  As a passenger in a car for an hour without a break 2 2 0  Lying down to rest in the afternoon when circumstances permit 1 0 0  Sitting and talking to someone 0 0 0  Sitting quietly after a lunch without alcohol 0 0 0  In a car, while stopped for a few minutes in traffic 0 0 0  Total score 8 6 2     PAST MEDICAL HISTORY :   has a past medical history of Anemia, Anxiety, Asthma, Atypical chest pain, Dysrhythmia, GERD (gastroesophageal reflux disease), HLD (hyperlipidemia), Hypertension, Hypothyroid, Obese, Palpitations, and Shortness of breath dyspnea.  has a past surgical history that  includes Breast reduction surgery; Cesarean section; Colonoscopy; Knee arthroscopy (Left, 05/10/2015); Reduction mammaplasty (Bilateral, 04/07/1996); Hysteroscopy with D & C (N/A, 01/31/2021); Dilation and curettage of uterus; Colonoscopy with propofol  (N/A, 04/14/2022); Breast biopsy (Right, 06/16/2023); and Breast biopsy (Right, 06/16/2023). Prior to Admission medications   Medication Sig Start Date End Date Taking? Authorizing Provider  albuterol  (PROAIR  HFA) 108 (90 Base) MCG/ACT inhaler Inhale 1-2 puffs into the lungs every 6 (six) hours as needed for wheezing or shortness of breath. 03/26/22   Tamea Dedra CROME, MD  aspirin EC 81 MG tablet Take 81 mg by mouth daily.    [provider]  cetirizine (ZYRTEC) 10 MG tablet Take 10 mg by mouth daily as needed for allergies. 01/09/21   [provider]  docusate sodium (COLACE) 100 MG capsule Take 1 capsule twice a day by oral route. 09/22/23   [provider]  famotidine  (PEPCID ) 20 MG tablet Take 20 mg by mouth daily as needed for heartburn or indigestion.    [provider]  Fluticasone -Umeclidin-Vilant (TRELEGY ELLIPTA ) 200-62.5-25 MCG/ACT AEPB INHALE 1 PUFF ONCE DAILY IN THE AFTERNOON. (PLEASE SCHEDULE OFFICE VISIT FOR FURTHER REFILLS) 10/01/23   Tamea Dedra CROME, MD  hydrochlorothiazide  (HYDRODIURIL ) 25 MG tablet Take 25 mg by mouth daily. 05/10/20   [provider]  HYDROcodone -acetaminophen  (NORCO/VICODIN) 5-325 MG tablet Take 1 tablet by mouth every 4 (four) hours as needed.    [provider]  ibuprofen  (  ADVIL ) 600 MG tablet Take 1 tablet (600 mg total) by mouth every 8 (eight) hours as needed for mild pain or cramping. 01/31/21   Lake Read, MD  levothyroxine (SYNTHROID) 150 MCG tablet Take 150 mcg by mouth every morning. 06/25/22   [provider]  losartan  (COZAAR ) 50 MG tablet Take 1 tablet (50 mg total) by mouth daily. 11/30/23   Scoggins, Amber, NP  ondansetron  (ZOFRAN -ODT)  8 MG disintegrating tablet DISSOLVE 1 TABLET IN MOUTH TWICE DAILY AS NEEDED Patient taking differently: DISSOLVE 1 TABLET IN MOUTH TWICE DAILY AS NEEDED    [provider]  propranolol (INDERAL) 20 MG tablet Take 20 mg by mouth 3 (three) times daily.    [provider]  rosuvastatin  (CRESTOR ) 10 MG tablet Take 10 mg by mouth daily. 01/01/21   [provider]  semaglutide -weight management (WEGOVY ) 0.25 MG/0.5ML SOAJ SQ injection Inject 0.25 mg into the skin once a week. 11/30/23   Scoggins, Amber, NP  sertraline (ZOLOFT) 25 MG tablet Take 25 mg by mouth daily. 12/21/20   [provider]   Allergies  Allergen Reactions   Hydrocodone  Nausea Only   Tramadol  Nausea And Vomiting    FAMILY HISTORY:  family history includes Acute myelogenous leukemia in her mother; COPD in her mother; Epilepsy in her brother; Heart Problems in her mother, paternal grandfather, and paternal grandmother; Hypertension in her mother; Hypothyroidism in her mother; Ovarian cancer in her paternal aunt; Stroke in her father and mother. SOCIAL HISTORY:  reports that she has never smoked. She has never used smokeless tobacco. She reports that she does not currently use alcohol. She reports that she does not use drugs.   Review of Systems:  Gen:  Denies  fever, sweats, chills weight loss  HEENT: Denies blurred vision, double vision, ear pain, eye pain, hearing loss, nose bleeds, sore throat Cardiac:  No dizziness, chest pain or heaviness, chest tightness,edema, No JVD Resp:   No cough, -sputum production, -shortness of breath,-wheezing, -hemoptysis,  Gi: Denies swallowing difficulty, stomach pain, nausea or vomiting, diarrhea, constipation, bowel incontinence Gu:  Denies bladder incontinence, burning urine Ext:   Denies Joint pain, stiffness or swelling Skin: Denies  skin rash, easy bruising or bleeding or hives Endoc:  Denies polyuria, polydipsia , polyphagia or weight change Psych:    Denies depression, insomnia or hallucinations  Other:  All other systems negative  VITAL SIGNS: BP (!) 134/90   Pulse 64   Temp 97.6 F (36.4 C) (Temporal)   Ht 5' 3 (1.6 m)   Wt 209 lb 12.8 oz (95.2 kg)   SpO2 96%   BMI 37.16 kg/m     Physical Examination:   General Appearance: No distress  EYES PERRLA, EOM intact.   NECK Supple, No JVD Pulmonary: normal breath sounds, No wheezing.  CardiovascularNormal S1,S2.  No m/r/g.   Abdomen: Benign, Soft, non-tender. Skin:   warm, no rashes, no ecchymosis  Extremities: normal, no cyanosis, clubbing. Neuro:without focal findings,  speech normal  PSYCHIATRIC: Mood, affect within normal limits.   ASSESSMENT AND PLAN  OSA For mask discomfort, will try patient on the Airfit F40 FFM. Discussed the consequences of untreated sleep apnea. Advised not to drive drowsy for safety of patient and others. Will follow up in 3 months.    HTN BP elevated, advised patient to follow up with PCP for further management.    Obesity Counseled patient on diet and lifestyle modification.    Patient  satisfied with Plan of action and  management. All questions answered  I spent a total of 62 minutes reviewing chart data, face-to-face evaluation with the patient, counseling and coordination of care as detailed above.    Cendy Oconnor, M.D.  Sleep Medicine [facility]

## 2024-01-15 ENCOUNTER — Ambulatory Visit: Admitting: Cardiology

## 2024-01-19 ENCOUNTER — Other Ambulatory Visit: Payer: Self-pay

## 2024-01-19 MED ORDER — HYDROCHLOROTHIAZIDE 25 MG PO TABS: 25.0000 mg | ORAL_TABLET | Freq: Every day | ORAL | 0 refills | Status: DC

## 2024-01-19 MED ORDER — SERTRALINE HCL 50 MG PO TABS: 50.0000 mg | ORAL_TABLET | Freq: Every day | ORAL | 0 refills | Status: DC

## 2024-02-02 ENCOUNTER — Telehealth: Payer: Self-pay | Admitting: Cardiology

## 2024-02-02 ENCOUNTER — Encounter: Payer: Self-pay | Admitting: Cardiology

## 2024-02-02 ENCOUNTER — Ambulatory Visit (INDEPENDENT_AMBULATORY_CARE_PROVIDER_SITE_OTHER): Admitting: Cardiology

## 2024-02-02 VITALS — BP 124/83 | HR 68 | Ht 63.0 in | Wt 210.2 lb

## 2024-02-02 DIAGNOSIS — Z13220 Encounter for screening for lipoid disorders: Secondary | ICD-10-CM | POA: Diagnosis not present

## 2024-02-02 DIAGNOSIS — E039 Hypothyroidism, unspecified: Secondary | ICD-10-CM | POA: Diagnosis not present

## 2024-02-02 DIAGNOSIS — Z131 Encounter for screening for diabetes mellitus: Secondary | ICD-10-CM | POA: Diagnosis not present

## 2024-02-02 DIAGNOSIS — R531 Weakness: Secondary | ICD-10-CM | POA: Insufficient documentation

## 2024-02-02 DIAGNOSIS — I10 Essential (primary) hypertension: Secondary | ICD-10-CM

## 2024-02-02 DIAGNOSIS — R0789 Other chest pain: Secondary | ICD-10-CM | POA: Diagnosis not present

## 2024-02-02 NOTE — Telephone Encounter (Signed)
 Entered in error

## 2024-02-02 NOTE — Progress Notes (Signed)
 Established Patient Office Visit  Subjective:  Patient ID: Kimberly Rivas, female    DOB: May 07, 1965  Age: 58 y.o. MRN: [id number]  Chief Complaint  Patient presents with   Acute Visit    Pt's arms went weak and limp last night and lightheaded. This happened a month ago as well. This morning they still feel wobbly.    Patient in office for an acute visit, complaining of two separate episodes of arm weakness on exertion. Patient states the first episode happened about a month ago while [place] the [place], her arms became weak, heaviness in her chest. Resolved after what she thinks was about three minutes. After the episode, her arms felt heavy.  Second episode happened last evening while grocery shopping. While pushing the full grocery cart, her arms became weak, and heaviness in her chest. Again, episode lasted about three minutes. Today, patient reports her arms are heavy, left worse than right. EKG in office today showed no acute changes. Patient has an appointment with her cardiologist next week.  Will do lab work today.  Patient has a history of spinal stenosis. If cardiac work up is normal, labs are normal, will refer to neurology.     No other concerns at this time.   Past Medical History:  Diagnosis Date   Anemia    H/O   Anxiety    Asthma    Atypical chest pain    Dysrhythmia    GERD (gastroesophageal reflux disease)    HLD (hyperlipidemia)    Hypertension    Hypothyroid    a. dx'd at age 19   Obese    Palpitations    a. 48 hr Holter 02/2015: NSR, rare PACs and PVCs. 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 NEGATIVE FOR ATYPIA OR MALIGNANCY.   BREAST REDUCTION SURGERY     CESAREAN SECTION     X3   COLONOSCOPY     COLONOSCOPY WITH PROPOFOL  N/A 04/14/2022   Procedure: COLONOSCOPY WITH PROPOFOL ;  Surgeon: Unk Corinn Skiff, MD;  Location: [facility] ENDOSCOPY;  Service: Gastroenterology;  Laterality: N/A;   DILATION AND CURETTAGE OF UTERUS     HYSTEROSCOPY WITH D & C N/A 01/31/2021   Procedure: DILATATION AND CURETTAGE /HYSTEROSCOPY WITH POLYPECTOMY;  Surgeon: Lake Read, MD;  Location: [facility] ORS;  Service: Gynecology;  Laterality: N/A;   KNEE ARTHROSCOPY Left 05/10/2015   Procedure: ARTHROSCOPY KNEE, partial medial meniscectomy;  Surgeon: Ozell Flake, MD;  Location: [facility] ORS;  Service: Orthopedics;  Laterality: Left;   REDUCTION MAMMAPLASTY Bilateral 04/07/1996    Social History   Socioeconomic History   Marital status: Married    Spouse name: Not on file   Number of children: Not on file   Years of education: Not on file   Highest education level: Not on file  Occupational History   Not on file  Tobacco Use   Smoking status: Never   Smokeless tobacco: Never  Vaping Use   Vaping status: Never Used  Substance and Sexual Activity   Alcohol use: Not Currently   Drug use: No   Sexual  activity: Yes    Birth control/protection: Post-menopausal  Other Topics Concern   Not on file  Social History Narrative   Not on file   Social Drivers of Health   Financial Resource Strain: Low Risk  (02/19/2023)   [facility]   Overall Financial Resource Strain (CARDIA)    Difficulty of Paying Living Expenses: Not hard at all  Food Insecurity: No Food Insecurity (02/19/2023)   [facility]   Hunger Vital Sign    Within the past 12 months, you worried that your food would run out before you got the money to buy more.: Never true    Within the past 12 months, the food you bought just didn't last and you didn't  have money to get more.: Never true  Transportation Needs: No Transportation Needs (02/19/2023)   Received from [facility] - Transportation    In the past 12 months, has lack of transportation kept you from medical appointments or from getting medications?: No    Lack of Transportation (Non-Medical): No  Physical Activity: Not on file  Stress: Not on file  Social Connections: Not on file  Intimate Partner Violence: Not on file    Family History  Problem Relation Age of Onset   Acute myelogenous leukemia Mother    COPD Mother    Heart Problems Mother    Hypertension Mother    Hypothyroidism Mother    Stroke Mother    Stroke Father    Epilepsy Brother    Ovarian cancer Paternal Aunt        30s   Heart Problems Paternal Grandmother    Heart Problems Paternal Grandfather    Breast cancer Neg Hx     Allergies  Allergen Reactions   Hydrocodone  Nausea Only   Tramadol  Nausea And Vomiting    Outpatient Medications Prior to Visit  Medication Sig   albuterol  (PROAIR  HFA) 108 (90 Base) MCG/ACT inhaler Inhale 1-2 puffs into the lungs every 6 (six) hours as needed for wheezing or shortness of breath.   aspirin EC 81 MG tablet Take 81 mg by mouth daily.   cetirizine (ZYRTEC) 10 MG tablet Take 10 mg by mouth daily as needed for allergies.   docusate sodium (COLACE) 100 MG capsule Take 1 capsule twice a day by oral route.   famotidine  (PEPCID ) 20 MG tablet Take 20 mg by mouth daily as needed for heartburn or indigestion.   Fluticasone -Umeclidin-Vilant (TRELEGY ELLIPTA ) 200-62.5-25 MCG/ACT AEPB INHALE 1 PUFF ONCE DAILY IN THE AFTERNOON. (PLEASE SCHEDULE OFFICE VISIT FOR FURTHER REFILLS)   hydrochlorothiazide  (HYDRODIURIL ) 25 MG tablet Take 1 tablet (25 mg total) by mouth daily.   HYDROcodone -acetaminophen  (NORCO/VICODIN) 5-325 MG tablet Take 1 tablet by mouth every 4 (four) hours as needed.   ibuprofen  (ADVIL ) 600 MG tablet Take 1 tablet (600 mg total) by mouth  every 8 (eight) hours as needed for mild pain or cramping.   levothyroxine (SYNTHROID) 150 MCG tablet Take 150 mcg by mouth every morning.   losartan  (COZAAR ) 50 MG tablet Take 1 tablet (50 mg total) by mouth daily.   ondansetron  (ZOFRAN -ODT) 8 MG disintegrating tablet DISSOLVE 1 TABLET IN MOUTH TWICE DAILY AS NEEDED (Patient taking differently: No sig reported)   propranolol (INDERAL) 20 MG tablet Take 20 mg by mouth 3 (three) times daily.   rosuvastatin  (CRESTOR ) 10 MG tablet Take 10 mg by mouth daily.   sertraline (ZOLOFT) 50 MG tablet Take  1 tablet (50 mg total) by mouth daily.   semaglutide -weight management (WEGOVY ) 0.25 MG/0.5ML SOAJ SQ injection Inject 0.25 mg into the skin once a week. (Patient not taking: Reported on 02/02/2024)   No facility-administered medications prior to visit.    Review of Systems  Constitutional: Negative.   HENT: Negative.    Eyes: Negative.   Respiratory: Negative.  Negative for shortness of breath.   Cardiovascular: Negative.  Negative for chest pain.  Gastrointestinal: Negative.  Negative for abdominal pain, constipation and diarrhea.  Genitourinary: Negative.   Musculoskeletal:  Negative for joint pain and myalgias.  Skin: Negative.   Neurological: Negative.  Negative for dizziness and headaches.  Endo/Heme/Allergies: Negative.   All other systems reviewed and are negative.      Objective:   BP 124/83   Pulse 68   Ht 5' 3 (1.6 m)   Wt 210 lb 3.2 oz (95.3 kg)   SpO2 97%   BMI 37.24 kg/m   Vitals:   02/02/24 1123  BP: 124/83  Pulse: 68  Height: 5' 3 (1.6 m)  Weight: 210 lb 3.2 oz (95.3 kg)  SpO2: 97%  BMI (Calculated): 37.24    Physical Exam Vitals and nursing note reviewed.  Constitutional:      Appearance: Normal appearance. She is normal weight.  HENT:     Head: Normocephalic and atraumatic.     Nose: Nose normal.     Mouth/Throat:     Mouth: Mucous membranes are moist.  Eyes:     Extraocular Movements: Extraocular  movements intact.     Conjunctiva/sclera: Conjunctivae normal.     Pupils: Pupils are equal, round, and reactive to light.  Cardiovascular:     Rate and Rhythm: Normal rate and regular rhythm.     Pulses: Normal pulses.     Heart sounds: Normal heart sounds.  Pulmonary:     Effort: Pulmonary effort is normal.     Breath sounds: Normal breath sounds.  Abdominal:     General: Abdomen is flat. Bowel sounds are normal.     Palpations: Abdomen is soft.  Musculoskeletal:        General: Normal range of motion.     Cervical back: Normal range of motion.  Skin:    General: Skin is warm and dry.  Neurological:     General: No focal deficit present.     Mental Status: She is alert and oriented to person, place, and time.  Psychiatric:        Mood and Affect: Mood normal.        Behavior: Behavior normal.        Thought Content: Thought content normal.        Judgment: Judgment normal.      No results found for any visits on 02/02/24.  No results found for this or any previous visit (from the past 2160 hours).    Assessment & Plan:  Keep appointment with cardiology Lab work today  Problem List Items Addressed This Visit       Cardiovascular and Mediastinum   Primary hypertension - Primary   Relevant Orders   CMP14+EGFR   Magnesium      Endocrine   Hypothyroid   Relevant Orders   TSH     Other   Chest heaviness   Weakness   Relevant Orders   Magnesium    ANA, IFA (with reflex)   CRP (C-Reactive Protein)   Other Visit Diagnoses       Diabetes mellitus screening  Relevant Orders   CMP14+EGFR   Hemoglobin A1c     Lipid screening       Relevant Orders   Lipid Profile       Return in about 4 weeks (around 03/01/2024).   Total time spent: 25 minutes. This time includes review of previous notes and results and patient face to face interaction during today's visit.    Jeoffrey Pollen, NP  02/02/2024   This document may have been prepared by [facility]  Voice Recognition software and as such may include unintentional dictation errors.

## 2024-02-03 ENCOUNTER — Encounter: Payer: Self-pay | Admitting: Cardiology

## 2024-02-03 LAB — CMP14+EGFR
ALT: 31 IU/L (ref 0–32)
AST: 28 IU/L (ref 0–40)
Albumin: 4.2 g/dL (ref 3.8–4.9)
Alkaline Phosphatase: 123 IU/L (ref 49–135)
BUN/Creatinine Ratio: 23 (ref 12–28)
BUN: 18 mg/dL (ref 8–27)
Bilirubin Total: 0.5 mg/dL (ref 0.0–1.2)
CO2: 24 mmol/L (ref 20–29)
Calcium: 9.5 mg/dL (ref 8.7–10.3)
Chloride: 100 mmol/L (ref 96–106)
Creatinine, Ser: 0.78 mg/dL (ref 0.57–1.00)
Globulin, Total: 3.1 g/dL (ref 1.5–4.5)
Glucose: 111 mg/dL — ABNORMAL HIGH (ref 70–99)
Potassium: 3.7 mmol/L (ref 3.5–5.2)
Sodium: 139 mmol/L (ref 134–144)
Total Protein: 7.3 g/dL (ref 6.0–8.5)
eGFR: 87 mL/min/1.73 (ref >59)

## 2024-02-03 LAB — HEMOGLOBIN A1C
Est. average glucose Bld gHb Est-mCnc: 105 mg/dL
Hgb A1c MFr Bld: 5.3 % (ref 4.8–5.6)

## 2024-02-03 LAB — LIPID PANEL
Chol/HDL Ratio: 3.7 ratio (ref 0.0–4.4)
Cholesterol, Total: 183 mg/dL (ref 100–199)
HDL: 49 mg/dL (ref >39)
LDL Chol Calc (NIH): 99 mg/dL (ref 0–99)
Triglycerides: 205 mg/dL — ABNORMAL HIGH (ref 0–149)
VLDL Cholesterol Cal: 35 mg/dL (ref 5–40)

## 2024-02-03 LAB — C-REACTIVE PROTEIN: CRP: 2 mg/L (ref 0–10)

## 2024-02-03 LAB — TSH: TSH: 5.75 u[IU]/mL — ABNORMAL HIGH (ref 0.450–4.500)

## 2024-02-03 LAB — ANTINUCLEAR ANTIBODIES, IFA: ANA Titer 1: POSITIVE — AB

## 2024-02-03 LAB — FANA STAINING PATTERNS: Speckled Pattern: 1:80 {titer}

## 2024-02-03 LAB — MAGNESIUM: Magnesium: 2.1 mg/dL (ref 1.6–2.3)

## 2024-02-08 ENCOUNTER — Other Ambulatory Visit: Payer: Self-pay | Admitting: Cardiology

## 2024-02-08 ENCOUNTER — Ambulatory Visit: Payer: Self-pay | Admitting: Cardiology

## 2024-02-08 DIAGNOSIS — R7689 Other specified abnormal immunological findings in serum: Secondary | ICD-10-CM

## 2024-02-08 DIAGNOSIS — E039 Hypothyroidism, unspecified: Secondary | ICD-10-CM

## 2024-02-11 ENCOUNTER — Ambulatory Visit: Attending: Cardiovascular Disease | Admitting: Cardiovascular Disease

## 2024-02-11 ENCOUNTER — Encounter: Payer: Self-pay | Admitting: Cardiovascular Disease

## 2024-02-11 VITALS — BP 128/68 | HR 68 | Ht 63.0 in | Wt 212.6 lb

## 2024-02-11 DIAGNOSIS — E785 Hyperlipidemia, unspecified: Secondary | ICD-10-CM

## 2024-02-11 DIAGNOSIS — R072 Precordial pain: Secondary | ICD-10-CM

## 2024-02-11 DIAGNOSIS — R002 Palpitations: Secondary | ICD-10-CM | POA: Diagnosis not present

## 2024-02-11 DIAGNOSIS — I10 Essential (primary) hypertension: Secondary | ICD-10-CM | POA: Diagnosis not present

## 2024-02-11 MED ORDER — METOPROLOL TARTRATE 100 MG PO TABS: ORAL_TABLET | ORAL | 0 refills | Status: DC

## 2024-02-11 NOTE — Patient Instructions (Addendum)
 Medication Instructions:  No changes *If you need a refill on your cardiac medications before your next appointment, please call your pharmacy*  Lab Work: None ordered If you have labs (blood work) drawn today and your tests are completely normal, you will receive your results only by: MyChart Message (if you have MyChart) OR A paper copy in the mail If you have any lab test that is abnormal or we need to change your treatment, we will call you to review the results.   Follow-Up: At [facility], you and your health needs are our priority.  As part of our continuing mission to provide you with exceptional heart care, our providers are all part of one team.  This team includes your primary Cardiologist (physician) and Advanced Practice Providers or APPs (Physician Assistants and Nurse Practitioners) who all work together to provide you with the care you need, when you need it.  Your next appointment:   Follow up as needed based on the results  We recommend signing up for the patient portal called MyChart.  Sign up information is provided on this After Visit Summary.  MyChart is used to connect with patients for Virtual Visits (Telemedicine).  Patients are able to view lab/test results, encounter notes, upcoming appointments, etc.  Non-urgent messages can be sent to your provider as well.   To learn more about what you can do with MyChart, go to [email or web address].   Other Instructions   Your cardiac CT will be scheduled at one of the below locations:   [facility] [address] [place], [place] [address] [phone]  If scheduled at [facility], please arrive to the Heart and Vascular Center 15 mins early for check-in and test prep.  There is spacious parking and easy access to the radiology department from the [facility] entrance. Please enter here and check-in with the desk attendant.   Please follow  these instructions carefully (unless otherwise directed):  An IV will be required for this test and Nitroglycerin  will be given.   On the Night Before the Test: Be sure to Drink plenty of water . Do not consume any caffeinated/decaffeinated beverages or chocolate 12 hours prior to your test. Do not take any antihistamines 12 hours prior to your test.  On the Day of the Test: Drink plenty of water  until 1 hour prior to the test. Do not eat any food 1 hour prior to test. You may take your regular medications prior to the test.  Take metoprolol  (Lopressor ) two hours prior to test. Hold the propanolol the morning of the test. If you take Furosemide/Hydrochlorothiazide /Spironolactone/Chlorthalidone, please HOLD on the morning of the test. Patients who wear a continuous glucose monitor MUST remove the device prior to scanning. FEMALES- please wear underwire-free bra if available, avoid dresses & tight clothing  After the Test: Drink plenty of water . After receiving IV contrast, you may experience a mild flushed feeling. This is normal. On occasion, you may experience a mild rash up to 24 hours after the test. This is not dangerous. If this occurs, you can take Benadryl 25 mg, Zyrtec, Claritin, or Allegra and increase your fluid intake. (Patients taking Tikosyn should avoid Benadryl, and may take Zyrtec, Claritin, or Allegra) If you experience trouble breathing, this can be serious. If it is severe call 911 IMMEDIATELY. If it is mild, please call our office.  We will call to schedule your test 2-4 weeks out understanding that some insurance companies will  need an authorization prior to the service being performed.   For more information and frequently asked questions, please visit our website : [email or web address]  For non-scheduling related questions, please contact the cardiac imaging nurse navigator should you have any questions/concerns: Cardiac Imaging Nurse  Navigators Direct Office Dial: [phone]   For scheduling needs, including cancellations and rescheduling, please call Brittany, [phone].

## 2024-02-11 NOTE — Progress Notes (Signed)
 Cardiology Office Note   Date:  02/11/2024   ID:  Kimberly Rivas, DOB 1966-04-03, MRN [id number]  PCP:  Kimberly Gauze, NP  Cardiologist:   Kimberly Cage, MD   Chief Complaint  Patient presents with   New Patient (Initial Visit)    New pt  / The pt has no complaints of chest pain, SOB. The patient report chest pressure at times and both arms goes weak and she has had 3 episodes , medciation reviewed verbally with patient      History of Present Illness: Kimberly Rivas is a 58 y.o. female who was referred by [employer] for evaluation of chest pain. She has history of essential hypertension, asthma, palpitations, obesity and hyperlipidemia. She was seen by me in 2018 for chest pain and exertional dyspnea. She had cardiac CTA done in 2018 which showed a calcium  score of 26.5 with mild calcified plaque in the mid LAD. She is here for evaluation of chest pain.  She had 4 episodes of chest discomfort since September.  The pain is described as tightness and heavy feeling that is associated with exertion.  Symptoms resolve with resting.  It is associated with shortness of breath but no GI symptoms.  She has occasional palpitations but her symptoms are overall well-controlled with small dose of propranolol.   Past Medical History:  Diagnosis Date   Anemia    H/O   Anxiety    Asthma    Atypical chest pain    Dysrhythmia    GERD (gastroesophageal reflux disease)    HLD (hyperlipidemia)    Hypertension    Hypothyroid    a. dx'd at age 49   Obese    Palpitations    a. 48 hr Holter 02/2015: NSR, rare PACs and PVCs. 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 NEGATIVE FOR ATYPIA OR MALIGNANCY.   BREAST REDUCTION SURGERY     CESAREAN SECTION     X3   COLONOSCOPY     COLONOSCOPY WITH PROPOFOL  N/A 04/14/2022   Procedure: COLONOSCOPY WITH PROPOFOL ;  Surgeon: Unk Kimberly Skiff, MD;  Location: [facility] ENDOSCOPY;  Service: Gastroenterology;  Laterality: N/A;   DILATION AND CURETTAGE OF UTERUS     HYSTEROSCOPY WITH D & C N/A 01/31/2021   Procedure: DILATATION AND CURETTAGE /HYSTEROSCOPY WITH POLYPECTOMY;  Surgeon: Kimberly Read, MD;  Location: [facility] ORS;  Service: Gynecology;  Laterality: N/A;   KNEE ARTHROSCOPY Left 05/10/2015   Procedure: ARTHROSCOPY KNEE, partial medial meniscectomy;  Surgeon: Kimberly Flake, MD;  Location: [facility] ORS;  Service: Orthopedics;  Laterality: Left;   REDUCTION MAMMAPLASTY Bilateral 04/07/1996     Current Outpatient Medications  Medication Sig Dispense Refill   albuterol  (PROAIR  HFA) 108 (90 Base) MCG/ACT inhaler Inhale 1-2 puffs into the lungs every 6 (six) hours as needed for wheezing or shortness of breath. 1 each 5   aspirin EC 81 MG tablet Take 81 mg by mouth daily.     cetirizine (ZYRTEC) 10 MG tablet Take 10 mg by mouth daily as needed for allergies.     docusate sodium (COLACE) 100  MG capsule Take 1 capsule twice a day by oral route.     famotidine  (PEPCID ) 20 MG tablet Take 20 mg by mouth daily as needed for heartburn or indigestion.     Fluticasone -Umeclidin-Vilant (TRELEGY ELLIPTA ) 200-62.5-25 MCG/ACT AEPB INHALE 1 PUFF ONCE DAILY IN THE AFTERNOON. (PLEASE SCHEDULE OFFICE VISIT FOR FURTHER REFILLS) 60 each 11   hydrochlorothiazide  (HYDRODIURIL ) 25 MG tablet Take 1 tablet (25 mg total) by mouth daily. 90 tablet 0   HYDROcodone -acetaminophen  (NORCO/VICODIN) 5-325 MG tablet Take 1 tablet by mouth every 4 (four) hours as needed.     ibuprofen  (ADVIL ) 600 MG tablet Take 1 tablet (600 mg total) by mouth every 8 (eight) hours as needed for mild pain or cramping. 30  tablet 0   levothyroxine (SYNTHROID) 150 MCG tablet Take 150 mcg by mouth every morning.     losartan  (COZAAR ) 50 MG tablet Take 1 tablet (50 mg total) by mouth daily. 90 tablet 1   ondansetron  (ZOFRAN -ODT) 8 MG disintegrating tablet DISSOLVE 1 TABLET IN MOUTH TWICE DAILY AS NEEDED (Patient taking differently: No sig reported)     propranolol (INDERAL) 20 MG tablet Take 20 mg by mouth 3 (three) times daily.     rosuvastatin  (CRESTOR ) 10 MG tablet Take 10 mg by mouth daily.     semaglutide -weight management (WEGOVY ) 0.25 MG/0.5ML SOAJ SQ injection Inject 0.25 mg into the skin once a week. 2 mL 3   sertraline (ZOLOFT) 50 MG tablet Take 1 tablet (50 mg total) by mouth daily. 90 tablet 0   No current facility-administered medications for this visit.    Allergies:   Hydrocodone  and Tramadol     Social History:  The patient  reports that she has never smoked. She has never used smokeless tobacco. She reports that she does not currently use alcohol. She reports that she does not use drugs.   Family History:  The patient's family history includes Acute myelogenous leukemia in her mother; COPD in her mother; Epilepsy in her brother; Heart Problems in her mother, paternal grandfather, and paternal grandmother; Hypertension in her mother; Hypothyroidism in her mother; Ovarian cancer in her paternal aunt; Stroke in her father and mother.    ROS:  Please see the history of present illness.   Otherwise, review of systems are positive for none.   All other systems are reviewed and negative.    PHYSICAL EXAM: VS:  BP 128/68 (BP Location: Right Arm, Cuff Size: Normal)   Pulse 68   Ht 5' 3 (1.6 m)   Wt 212 lb 9.6 oz (96.4 kg)   SpO2 96%   BMI 37.66 kg/m  , BMI Body mass index is 37.66 kg/m. GEN: Well nourished, well developed, in no acute distress  HEENT: normal  Neck: no JVD, carotid bruits, or masses Cardiac: RRR; no murmurs, rubs, or gallops,no edema  Respiratory:  clear to auscultation  bilaterally, normal work of breathing GI: soft, nontender, nondistended, + BS MS: no deformity or atrophy  Skin: warm and dry, no rash Neuro:  Strength and sensation are intact Psych: euthymic mood, full affect   EKG:  EKG is ordered today. The ekg ordered today demonstrates :  Normal sinus rhythm Nonspecific ST abnormality When compared with ECG of 29-Jan-2021 08:37, No significant change was found   Recent Labs: 02/02/2024: ALT 31; BUN 18; Creatinine, Ser 0.78; Magnesium  2.1; Potassium 3.7; Sodium 139; TSH 5.750    Lipid Panel    Component Value Date/Time   CHOL 183 02/02/2024 1221  TRIG 205 (H) 02/02/2024 1221   HDL 49 02/02/2024 1221   CHOLHDL 3.7 02/02/2024 1221   CHOLHDL 6.2 01/09/2017 0754   VLDL 51 (H) 01/09/2017 0754   LDLCALC 99 02/02/2024 1221      Wt Readings from Last 3 Encounters:  02/11/24 212 lb 9.6 oz (96.4 kg)  02/02/24 210 lb 3.2 oz (95.3 kg)  12/23/23 209 lb 12.8 oz (95.2 kg)        ASSESSMENT AND PLAN:   1.  Exertional chest pain worrisome for new angina: Here EKG shows nonspecific ST changes.  I recommend evaluation with cardiac CTA.    2. Palpitations:  Controlled with propranolol.  3. Essential hypertension:  Blood pressure is controlled on current medications.  4.  Hyperlipidemia: Currently on rosuvastatin  10 mg daily.  Most recent lipid profile showed an LDL of 99.  If cardiac CTA shows significant coronary artery disease, we will have to be more aggressive getting her LDL below 55.   Disposition:   FU to be determined based on the results of cardiac CTA.   Signed,  Kimberly Cage, MD  02/11/2024 9:42 AM    [facility]

## 2024-02-16 ENCOUNTER — Other Ambulatory Visit: Payer: Self-pay

## 2024-02-17 MED ORDER — LEVOTHYROXINE SODIUM 150 MCG PO TABS: 150.0000 ug | ORAL_TABLET | Freq: Every morning | ORAL | 0 refills | Status: DC

## 2024-02-22 ENCOUNTER — Encounter (HOSPITAL_COMMUNITY): Payer: Self-pay

## 2024-02-25 ENCOUNTER — Ambulatory Visit (HOSPITAL_COMMUNITY)
Admission: RE | Admit: 2024-02-25 | Discharge: 2024-02-25 | Disposition: A | Discharge status: Home / Self Care | Source: Ambulatory Visit | Attending: Student in an Organized Health Care Education/Training Program | Admitting: Student in an Organized Health Care Education/Training Program

## 2024-02-25 DIAGNOSIS — R072 Precordial pain: Secondary | ICD-10-CM | POA: Insufficient documentation

## 2024-02-25 MED ORDER — IOHEXOL 350 MG/ML SOLN
100.0000 mL | Freq: Once | INTRAVENOUS | Status: AC | PRN
  Administered 2024-02-25: 100 mL via INTRAVENOUS

## 2024-02-25 MED ORDER — NITROGLYCERIN 0.4 MG SL SUBL
0.8000 mg | SUBLINGUAL_TABLET | Freq: Once | SUBLINGUAL | Status: AC
  Administered 2024-02-25: 0.8 mg via SUBLINGUAL

## 2024-03-01 ENCOUNTER — Ambulatory Visit: Admitting: Cardiology

## 2024-03-01 ENCOUNTER — Telehealth: Payer: Self-pay | Admitting: Cardiovascular Disease

## 2024-03-01 ENCOUNTER — Other Ambulatory Visit: Payer: Self-pay | Admitting: Cardiology

## 2024-03-01 ENCOUNTER — Encounter: Payer: Self-pay | Admitting: Cardiology

## 2024-03-01 VITALS — BP 126/88 | HR 71 | Ht 63.0 in | Wt 205.8 lb

## 2024-03-01 DIAGNOSIS — R7689 Other specified abnormal immunological findings in serum: Secondary | ICD-10-CM

## 2024-03-01 DIAGNOSIS — Z131 Encounter for screening for diabetes mellitus: Secondary | ICD-10-CM

## 2024-03-01 DIAGNOSIS — E039 Hypothyroidism, unspecified: Secondary | ICD-10-CM

## 2024-03-01 DIAGNOSIS — I10 Essential (primary) hypertension: Secondary | ICD-10-CM | POA: Diagnosis not present

## 2024-03-01 NOTE — Progress Notes (Unsigned)
 Established Patient Office Visit  Subjective:  Patient ID: Kimberly Rivas, female    DOB: 08/18/1965  Age: 58 y.o. MRN: [id number]  Chief Complaint  Patient presents with   Follow-up    4 week follow up    Patient in office for 4 week follow up, discuss recent lab results. Patient seen on 02/02/24 complaining of two separate episodes of arm weakness on exertion. Patient seen by cardiology, CCTA ordered, score of 99 in the LAD, likely not the cause of her symptoms. Patient comes in today complaining of a few more episodes similar to the previous ones.  Discuss recent lab work. ANA was abnormal, patient referred to rheumatology. Kidney function, liver enzymes and electrolytes were all normal. Triglycerides slightly elevated despite patient fasting, improved from previous check. TSH elevated, will recheck today. A1c and CRP were normal.  Blood pressure well controlled today. Continue same medications.     No other concerns at this time.   Past Medical History:  Diagnosis Date   Anemia    H/O   Anxiety    Asthma    Atypical chest pain    Dysrhythmia    GERD (gastroesophageal reflux disease)    HLD (hyperlipidemia)    Hypertension    Hypothyroid    a. dx'd at age 52   Obese    Palpitations    a. 48 hr Holter 02/2015: NSR, rare PACs and PVCs. 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 NEGATIVE FOR ATYPIA OR MALIGNANCY.   BREAST REDUCTION SURGERY     CESAREAN SECTION     X3   COLONOSCOPY     COLONOSCOPY WITH PROPOFOL  N/A 04/14/2022   Procedure: COLONOSCOPY WITH PROPOFOL ;  Surgeon: Unk Corinn Skiff, MD;   Location: [facility] ENDOSCOPY;  Service: Gastroenterology;  Laterality: N/A;   DILATION AND CURETTAGE OF UTERUS     HYSTEROSCOPY WITH D & C N/A 01/31/2021   Procedure: DILATATION AND CURETTAGE /HYSTEROSCOPY WITH POLYPECTOMY;  Surgeon: Lake Read, MD;  Location: [facility] ORS;  Service: Gynecology;  Laterality: N/A;   KNEE ARTHROSCOPY Left 05/10/2015   Procedure: ARTHROSCOPY KNEE, partial medial meniscectomy;  Surgeon: Ozell Flake, MD;  Location: [facility] ORS;  Service: Orthopedics;  Laterality: Left;   REDUCTION MAMMAPLASTY Bilateral 04/07/1996    Social History   Socioeconomic History   Marital status: Married    Spouse name: Not on file   Number of children: Not on file   Years of education: Not on file   Highest education level: Not on file  Occupational History   Not on file  Tobacco Use   Smoking status: Never   Smokeless tobacco: Never  Vaping Use   Vaping status: Never Used  Substance and Sexual Activity   Alcohol use: Not Currently   Drug use: No   Sexual activity: Yes    Birth control/protection: Post-menopausal  Other Topics Concern   Not on file  Social History Narrative   Not on file   Social Drivers of Health   Financial Resource Strain: Low Risk  (02/19/2023)   Received from [facility]  Overall Financial Resource Strain (CARDIA)    Difficulty of Paying Living Expenses: Not hard at all  Food Insecurity: No Food Insecurity (02/19/2023)   Received from [facility]   Hunger Vital Sign    Within the past 12 months, you worried that your food would run out before you got the money to buy more.: Never true    Within the past 12 months, the food you bought just didn't last and you didn't have money to get more.: Never true  Transportation Needs: No Transportation Needs (02/19/2023)   Received from [facility] - Transportation    In the past 12 months, has lack of transportation kept you from medical  appointments or from getting medications?: No    Lack of Transportation (Non-Medical): No  Physical Activity: Not on file  Stress: Not on file  Social Connections: Not on file  Intimate Partner Violence: Not on file    Family History  Problem Relation Age of Onset   Acute myelogenous leukemia Mother    COPD Mother    Heart Problems Mother    Hypertension Mother    Hypothyroidism Mother    Stroke Mother    Stroke Father    Epilepsy Brother    Ovarian cancer Paternal Aunt        30s   Heart Problems Paternal Grandmother    Heart Problems Paternal Grandfather    Breast cancer Neg Hx     Allergies  Allergen Reactions   Hydrocodone  Nausea Only   Tramadol  Nausea And Vomiting    Outpatient Medications Prior to Visit  Medication Sig   albuterol  (PROAIR  HFA) 108 (90 Base) MCG/ACT inhaler Inhale 1-2 puffs into the lungs every 6 (six) hours as needed for wheezing or shortness of breath.   aspirin EC 81 MG tablet Take 81 mg by mouth daily.   cetirizine (ZYRTEC) 10 MG tablet Take 10 mg by mouth daily as needed for allergies.   famotidine  (PEPCID ) 20 MG tablet Take 20 mg by mouth daily as needed for heartburn or indigestion.   Fluticasone -Umeclidin-Vilant (TRELEGY ELLIPTA ) 200-62.5-25 MCG/ACT AEPB INHALE 1 PUFF ONCE DAILY IN THE AFTERNOON. (PLEASE SCHEDULE OFFICE VISIT FOR FURTHER REFILLS)   hydrochlorothiazide  (HYDRODIURIL ) 25 MG tablet Take 1 tablet (25 mg total) by mouth daily.   ibuprofen  (ADVIL ) 600 MG tablet Take 1 tablet (600 mg total) by mouth every 8 (eight) hours as needed for mild pain or cramping.   levothyroxine  (SYNTHROID ) 150 MCG tablet Take 1 tablet (150 mcg total) by mouth every morning.   losartan  (COZAAR ) 50 MG tablet Take 1 tablet (50 mg total) by mouth daily.   propranolol (INDERAL) 20 MG tablet Take 20 mg by mouth 3 (three) times daily.   rosuvastatin  (CRESTOR ) 10 MG tablet Take 10 mg by mouth daily.   semaglutide -weight management (WEGOVY ) 0.25 MG/0.5ML SOAJ SQ  injection Inject 0.25 mg into the skin once a week.   sertraline  (ZOLOFT ) 50 MG tablet Take 1 tablet (50 mg total) by mouth daily.   [DISCONTINUED] docusate sodium (COLACE) 100 MG capsule Take 1 capsule twice a day by oral route. (Patient not taking: Reported on 03/01/2024)   [DISCONTINUED] HYDROcodone -acetaminophen  (NORCO/VICODIN) 5-325 MG tablet Take 1 tablet by mouth every 4 (four) hours as needed. (Patient not taking: Reported on 03/01/2024)   [DISCONTINUED] metoprolol  tartrate (LOPRESSOR ) 100 MG tablet Take one tablet two hours prior to the test (Patient not taking: Reported on 03/01/2024)   [DISCONTINUED] ondansetron  (ZOFRAN -ODT) 8  MG disintegrating tablet DISSOLVE 1 TABLET IN MOUTH TWICE DAILY AS NEEDED (Patient not taking: Reported on 03/01/2024)   No facility-administered medications prior to visit.    Review of Systems  Constitutional: Negative.   HENT: Negative.    Eyes: Negative.   Respiratory: Negative.  Negative for shortness of breath.   Cardiovascular: Negative.  Negative for chest pain.  Gastrointestinal: Negative.  Negative for abdominal pain, constipation and diarrhea.  Genitourinary: Negative.   Musculoskeletal:  Negative for joint pain and myalgias.  Skin: Negative.   Neurological: Negative.  Negative for dizziness and headaches.  Endo/Heme/Allergies: Negative.   All other systems reviewed and are negative.      Objective:   BP 126/88   Pulse 71   Ht 5' 3 (1.6 m)   Wt 205 lb 12.8 oz (93.4 kg)   SpO2 97%   BMI 36.46 kg/m   Vitals:   03/01/24 1500  BP: 126/88  Pulse: 71  Height: 5' 3 (1.6 m)  Weight: 205 lb 12.8 oz (93.4 kg)  SpO2: 97%  BMI (Calculated): 36.46    Physical Exam Vitals and nursing note reviewed.  Constitutional:      Appearance: Normal appearance. She is normal weight.  HENT:     Head: Normocephalic and atraumatic.     Nose: Nose normal.     Mouth/Throat:     Mouth: Mucous membranes are moist.  Eyes:     Extraocular  Movements: Extraocular movements intact.     Conjunctiva/sclera: Conjunctivae normal.     Pupils: Pupils are equal, round, and reactive to light.  Cardiovascular:     Rate and Rhythm: Normal rate and regular rhythm.     Pulses: Normal pulses.     Heart sounds: Normal heart sounds.  Pulmonary:     Effort: Pulmonary effort is normal.     Breath sounds: Normal breath sounds.  Abdominal:     General: Abdomen is flat. Bowel sounds are normal.     Palpations: Abdomen is soft.  Musculoskeletal:        General: Normal range of motion.     Cervical back: Normal range of motion.  Skin:    General: Skin is warm and dry.  Neurological:     General: No focal deficit present.     Mental Status: She is alert and oriented to person, place, and time.  Psychiatric:        Mood and Affect: Mood normal.        Behavior: Behavior normal.        Thought Content: Thought content normal.        Judgment: Judgment normal.      Results for orders placed or performed in visit on 03/01/24  TSH  Result Value Ref Range   TSH 0.932 0.450 - 4.500 uIU/mL    Recent Results (from the past 2160 hours)  CMP14+EGFR     Status: Abnormal   Collection Time: 02/02/24 12:21 PM  Result Value Ref Range   Glucose 111 (H) 70 - 99 mg/dL   BUN 18 8 - 27 mg/dL   Creatinine, Ser 9.21 0.57 - 1.00 mg/dL   eGFR 87 >40 fO/fpw/8.26   BUN/Creatinine Ratio 23 12 - 28   Sodium 139 134 - 144 mmol/L   Potassium 3.7 3.5 - 5.2 mmol/L   Chloride 100 96 - 106 mmol/L   CO2 24 20 - 29 mmol/L   Calcium  9.5 8.7 - 10.3 mg/dL   Total Protein 7.3 6.0 - 8.5  g/dL   Albumin 4.2 3.8 - 4.9 g/dL   Globulin, Total 3.1 1.5 - 4.5 g/dL   Bilirubin Total 0.5 0.0 - 1.2 mg/dL   Alkaline Phosphatase 123 49 - 135 IU/L   AST 28 0 - 40 IU/L   ALT 31 0 - 32 IU/L  Magnesium      Status: None   Collection Time: 02/02/24 12:21 PM  Result Value Ref Range   Magnesium  2.1 1.6 - 2.3 mg/dL  Lipid Profile     Status: Abnormal   Collection Time: 02/02/24  12:21 PM  Result Value Ref Range   Cholesterol, Total 183 100 - 199 mg/dL   Triglycerides 794 (H) 0 - 149 mg/dL   HDL 49 >60 mg/dL   VLDL Cholesterol Cal 35 5 - 40 mg/dL   LDL Chol Calc (NIH) 99 0 - 99 mg/dL   Chol/HDL Ratio 3.7 0.0 - 4.4 ratio    Comment:                                   T. Chol/HDL Ratio                                             Men  Women                               1/2 Avg.Risk  3.4    3.3                                   Avg.Risk  5.0    4.4                                2X Avg.Risk  9.6    7.1                                3X Avg.Risk 23.4   11.0   TSH     Status: Abnormal   Collection Time: 02/02/24 12:21 PM  Result Value Ref Range   TSH 5.750 (H) 0.450 - 4.500 uIU/mL  Hemoglobin A1c     Status: None   Collection Time: 02/02/24 12:21 PM  Result Value Ref Range   Hgb A1c MFr Bld 5.3 4.8 - 5.6 %    Comment:          Prediabetes: 5.7 - 6.4          Diabetes: >6.4          Glycemic control for adults with diabetes: <7.0    Est. average glucose Bld gHb Est-mCnc 105 mg/dL  ANA, IFA (with reflex)     Status: Abnormal   Collection Time: 02/02/24 12:21 PM  Result Value Ref Range   ANA Titer 1 Positive (A)     Comment:                                      Negative   <1:80  Borderline  1:80                                      Positive   >1:80   CRP (C-Reactive Protein)     Status: None   Collection Time: 02/02/24 12:21 PM  Result Value Ref Range   CRP 2 0 - 10 mg/L  FANA Staining Patterns     Status: None   Collection Time: 02/02/24 12:21 PM  Result Value Ref Range   Speckled Pattern 1:80     Comment: ICAP nomenclature: AC-2,4,5,29   Note: Comment     Comment: Pattern              Potential Disease Association -------------  --------------------------------------------- Homogeneous    Systemic Lupus Erythematosus, Drug Induced                Systemic Lupus Erythematosus, Chronic                Autoimmune  hepatitis, Juvenile Idiopathic                Arthritis -------------  --------------------------------------------- Speckled       Sjogren Syndrome, Systemic Lupus                Erythematosus, Subacute Cutaneous Lupus,                Neonatal Lupus, Congenital Heart Block,                Mixed Connective Tissue Disease,                Scleroderma-diffuse, Scleroderma-Autoimmune                Myositis Overlap Syndrome, Systemic Lupus                Erythematosus-Scleroderma-Autoimmune                Myositis Overlap Syndrome, Systemic                Autoimmune Rheumatic Disease,                Undifferentiated Connective Tissue Disease -------------  --------------------------------------------- Nucleolar      Systemic [place] lerosis, Scleroderma-Autoimmune                Myositis Overlap Syndrome, Sjogren                Syndrome, Raynaud phenomenon, Pulmonary                Arterial Hypertension, Systemic Autoimmune                Rheumatic Disease, Cancer -------------  --------------------------------------------- Centromere     Scleroderma-CREST, Limited Cutaneous SSc,                Raynaud's Phenomenon, Primary Biliary                Cholangitis -------------  --------------------------------------------- Nuclear Dot    Primary Biliary Cholangitis -------------  --------------------------------------------- Nuclear        Primary Biliary Cholangitis, Autoimmune Membrane       Hepatitis/Liver disease, Systemic Autoimmune                Rheumatic Disease, Autoimmune Cytopenias,                Linear Scleroderma, Antiphospholipid Syndrome -------------  ---------------------------------------------   TSH  Status: None   Collection Time: 03/01/24  3:31 PM  Result Value Ref Range   TSH 0.932 0.450 - 4.500 uIU/mL      Assessment & Plan:  TSH today Continue current medications  Problem List Items Addressed This Visit       Cardiovascular and Mediastinum   Primary  hypertension     Endocrine   Hypothyroid - Primary   Relevant Orders   TSH (Completed)     Other   Elevated antinuclear antibody (ANA) level   Relevant Orders   Ambulatory referral to Rheumatology    Return in about 3 months (around 06/01/2024) for fasting lab work prior.   Total time spent: 25 minutes. This time includes review of previous notes and results and patient face to face interaction during today's visit.    Jeoffrey Pollen, NP  03/01/2024   This document may have been prepared by Dragon Voice Recognition software and as such may include unintentional dictation errors.

## 2024-03-01 NOTE — Telephone Encounter (Signed)
Pt is calling to get her CT results

## 2024-03-01 NOTE — Telephone Encounter (Signed)
 Spoke with patient. Patient requesting CT results. Patient informed that Dr. Darron has not had a chance to review the results but once he does we will notify her with results. Patient verbalizes understanding.

## 2024-03-02 ENCOUNTER — Ambulatory Visit: Payer: Self-pay | Admitting: Cardiology

## 2024-03-02 ENCOUNTER — Ambulatory Visit: Payer: Self-pay | Admitting: Cardiovascular Disease

## 2024-03-02 DIAGNOSIS — R7689 Other specified abnormal immunological findings in serum: Secondary | ICD-10-CM | POA: Insufficient documentation

## 2024-03-02 LAB — TSH: TSH: 0.932 u[IU]/mL (ref 0.450–4.500)

## 2024-03-02 NOTE — Progress Notes (Signed)
 VM is full

## 2024-03-02 NOTE — Telephone Encounter (Signed)
I sent you a result note

## 2024-03-04 NOTE — Progress Notes (Signed)
Pt informed

## 2024-03-07 ENCOUNTER — Other Ambulatory Visit: Payer: Self-pay | Admitting: Emergency Medicine

## 2024-03-07 DIAGNOSIS — E785 Hyperlipidemia, unspecified: Secondary | ICD-10-CM

## 2024-03-07 MED ORDER — ROSUVASTATIN CALCIUM 20 MG PO TABS: 20.0000 mg | ORAL_TABLET | Freq: Every day | ORAL | 3 refills | Status: AC

## 2024-03-10 ENCOUNTER — Other Ambulatory Visit: Payer: Self-pay | Admitting: Pulmonary Disease

## 2024-03-24 ENCOUNTER — Ambulatory Visit: Admitting: Sleep Medicine

## 2024-04-15 ENCOUNTER — Other Ambulatory Visit: Payer: Self-pay | Admitting: Cardiology

## 2024-04-28 ENCOUNTER — Other Ambulatory Visit: Payer: Self-pay | Admitting: Cardiology

## 2024-04-29 ENCOUNTER — Other Ambulatory Visit: Payer: Self-pay

## 2024-05-05 NOTE — Telephone Encounter (Signed)
 I will work on this

## 2024-05-10 ENCOUNTER — Encounter: Payer: Self-pay | Admitting: Cardiovascular Disease

## 2024-05-10 ENCOUNTER — Ambulatory Visit: Admitting: Cardiovascular Disease

## 2024-05-10 ENCOUNTER — Other Ambulatory Visit: Payer: Self-pay | Admitting: Cardiovascular Disease

## 2024-05-10 VITALS — BP 90/60 | HR 100 | Ht 63.0 in | Wt 195.2 lb

## 2024-05-10 DIAGNOSIS — I2510 Atherosclerotic heart disease of native coronary artery without angina pectoris: Secondary | ICD-10-CM

## 2024-05-10 DIAGNOSIS — I10 Essential (primary) hypertension: Secondary | ICD-10-CM | POA: Diagnosis not present

## 2024-05-10 DIAGNOSIS — R002 Palpitations: Secondary | ICD-10-CM

## 2024-05-10 DIAGNOSIS — E785 Hyperlipidemia, unspecified: Secondary | ICD-10-CM | POA: Diagnosis not present

## 2024-05-10 MED ORDER — HYDROCHLOROTHIAZIDE 12.5 MG PO TABS: 12.5000 mg | ORAL_TABLET | Freq: Every day | ORAL | 1 refills | Status: AC

## 2024-05-10 MED ORDER — PROPRANOLOL HCL 20 MG PO TABS: 20.0000 mg | ORAL_TABLET | Freq: Three times a day (TID) | ORAL | 1 refills | Status: DC

## 2024-05-10 NOTE — Patient Instructions (Signed)
 Medication Instructions:  DECREASE the Hydrochlorothiazide  to 12.5 mg once daily  *If you need a refill on your cardiac medications before your next appointment, please call your pharmacy*  Lab Work: Your provider would like for you to have the following labs today: Lipid and Liver  If you have labs (blood work) drawn today and your tests are completely normal, you will receive your results only by: MyChart Message (if you have MyChart) OR A paper copy in the mail If you have any lab test that is abnormal or we need to change your treatment, we will call you to review the results.  Testing/Procedures: None ordered  Follow-Up: At [facility], you and your health needs are our priority.  As part of our continuing mission to provide you with exceptional heart care, our providers are all part of one team.  This team includes your primary Cardiologist (physician) and Advanced Practice Providers or APPs (Physician Assistants and Nurse Practitioners) who all work together to provide you with the care you need, when you need it.  Your next appointment:   6 month(s)  Provider:   You may see Dr. Darron or one of the following Advanced Practice Providers on your designated Care Team:   Lonni Meager, NP Lesley Maffucci, PA-C Bernardino Bring, PA-C Cadence [place], PA-C Tylene Lunch, NP Barnie Hila, NP    We recommend signing up for the patient portal called MyChart.  Sign up information is provided on this After Visit Summary.  MyChart is used to connect with patients for Virtual Visits (Telemedicine).  Patients are able to view lab/test results, encounter notes, upcoming appointments, etc.  Non-urgent messages can be sent to your provider as well.   To learn more about what you can do with MyChart, go to [email or web address].

## 2024-05-11 ENCOUNTER — Ambulatory Visit: Payer: Self-pay | Admitting: *Deleted

## 2024-05-11 LAB — HEPATIC FUNCTION PANEL
ALT: 30 [IU]/L (ref 0–32)
AST: 28 [IU]/L (ref 0–40)
Albumin: 4.2 g/dL (ref 3.8–4.9)
Alkaline Phosphatase: 103 [IU]/L (ref 49–135)
Bilirubin Total: 0.6 mg/dL (ref 0.0–1.2)
Bilirubin, Direct: 0.2 mg/dL (ref 0.00–0.40)
Total Protein: 7.2 g/dL (ref 6.0–8.5)

## 2024-05-11 LAB — LIPID PANEL
Chol/HDL Ratio: 3.2 ratio (ref 0.0–4.4)
Cholesterol, Total: 152 mg/dL (ref 100–199)
HDL: 48 mg/dL
LDL Chol Calc (NIH): 76 mg/dL (ref 0–99)
Triglycerides: 167 mg/dL — ABNORMAL HIGH (ref 0–149)
VLDL Cholesterol Cal: 28 mg/dL (ref 5–40)

## 2024-05-12 ENCOUNTER — Other Ambulatory Visit: Payer: Self-pay | Admitting: Cardiology

## 2024-06-17 ENCOUNTER — Ambulatory Visit: Payer: Self-pay | Admitting: Internal Medicine
